# Patient Record
Sex: Female | Born: 1990 | Race: Asian | Hispanic: No | Marital: Married | Smoking: Never smoker
Health system: Southern US, Community
[De-identification: ages and names within clinical notes are randomized; demographics above are authoritative.]

## PROBLEM LIST (undated history)

## (undated) DIAGNOSIS — F32A Depression, unspecified: Secondary | ICD-10-CM

## (undated) DIAGNOSIS — D649 Anemia, unspecified: Secondary | ICD-10-CM

## (undated) DIAGNOSIS — T7840XA Allergy, unspecified, initial encounter: Secondary | ICD-10-CM

## (undated) DIAGNOSIS — F329 Major depressive disorder, single episode, unspecified: Secondary | ICD-10-CM

## (undated) DIAGNOSIS — F419 Anxiety disorder, unspecified: Secondary | ICD-10-CM

## (undated) DIAGNOSIS — Z789 Other specified health status: Secondary | ICD-10-CM

## (undated) HISTORY — PX: TOOTH EXTRACTION: SUR596

## (undated) HISTORY — DX: Allergy, unspecified, initial encounter: T78.40XA

## (undated) HISTORY — DX: Major depressive disorder, single episode, unspecified: F32.9

## (undated) HISTORY — DX: Anxiety disorder, unspecified: F41.9

## (undated) HISTORY — DX: Depression, unspecified: F32.A

## (undated) HISTORY — PX: INDUCED ABORTION: SHX677

## (undated) HISTORY — PX: NO PAST SURGERIES: SHX2092

## (undated) HISTORY — DX: Anemia, unspecified: D64.9

---
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
## 2010-10-22 LAB — WET PREP, GENITAL
Trich, Wet Prep: NONE SEEN
Yeast Wet Prep HPF POC: NONE SEEN

## 2010-10-22 LAB — URINALYSIS, ROUTINE W REFLEX MICROSCOPIC
Glucose, UA: NEGATIVE mg/dL
Ketones, ur: NEGATIVE mg/dL
Specific Gravity, Urine: 1.015 (ref 1.005–1.030)
pH: 6 (ref 5.0–8.0)

## 2010-10-22 LAB — CBC
HCT: 41.7 % (ref 36.0–46.0)
Platelets: 255 10*3/uL (ref 150–400)
RDW: 13.7 % (ref 11.5–15.5)

## 2010-10-22 LAB — COMPREHENSIVE METABOLIC PANEL
Albumin: 4.4 g/dL (ref 3.5–5.2)
Alkaline Phosphatase: 50 U/L (ref 39–117)
BUN: 15 mg/dL (ref 6–23)
Creatinine, Ser: 0.88 mg/dL (ref 0.4–1.2)
Potassium: 3.7 mEq/L (ref 3.5–5.1)
Sodium: 140 mEq/L (ref 135–145)
Total Protein: 8.3 g/dL (ref 6.0–8.3)

## 2010-10-22 LAB — DIFFERENTIAL
Basophils Relative: 0 % (ref 0–1)
Monocytes Absolute: 1.1 10*3/uL — ABNORMAL HIGH (ref 0.1–1.0)
Monocytes Relative: 6 % (ref 3–12)
Neutro Abs: 16.2 10*3/uL — ABNORMAL HIGH (ref 1.7–7.7)

## 2010-10-22 LAB — URINE MICROSCOPIC-ADD ON

## 2013-12-30 LAB — OB RESULTS CONSOLE GC/CHLAMYDIA
CHLAMYDIA, DNA PROBE: NEGATIVE
Gonorrhea: NEGATIVE

## 2013-12-30 LAB — OB RESULTS CONSOLE HIV ANTIBODY (ROUTINE TESTING): HIV: NONREACTIVE

## 2013-12-30 LAB — OB RESULTS CONSOLE ANTIBODY SCREEN: Antibody Screen: NEGATIVE

## 2013-12-30 LAB — OB RESULTS CONSOLE ABO/RH: RH Type: POSITIVE

## 2013-12-30 LAB — OB RESULTS CONSOLE RUBELLA ANTIBODY, IGM: RUBELLA: IMMUNE

## 2013-12-30 LAB — OB RESULTS CONSOLE HEPATITIS B SURFACE ANTIGEN: Hepatitis B Surface Ag: NEGATIVE

## 2013-12-30 LAB — OB RESULTS CONSOLE RPR: RPR: NONREACTIVE

## 2014-01-17 NOTE — L&D Delivery Note (Signed)
Pt was admitted in early labor. She progressed along a nl labor curve without pit. She pushed for 1 hour and then had a SVD of one live viable female infant over a 1st degree midline tear. Placenta- S/I. EBL-400cc Bilateral labial tears and perineal tear closed with 3-0 chromic.

## 2014-05-16 ENCOUNTER — Other Ambulatory Visit (HOSPITAL_COMMUNITY): Payer: Self-pay | Admitting: Obstetrics and Gynecology

## 2014-05-16 DIAGNOSIS — O365930 Maternal care for other known or suspected poor fetal growth, third trimester, not applicable or unspecified: Secondary | ICD-10-CM

## 2014-05-23 ENCOUNTER — Other Ambulatory Visit (HOSPITAL_COMMUNITY): Payer: Self-pay | Admitting: Obstetrics and Gynecology

## 2014-05-23 ENCOUNTER — Encounter (HOSPITAL_COMMUNITY): Payer: Self-pay

## 2014-05-23 ENCOUNTER — Ambulatory Visit (HOSPITAL_COMMUNITY)
Admission: RE | Admit: 2014-05-23 | Discharge: 2014-05-23 | Disposition: A | Discharge status: Home / Self Care | Payer: Medicaid Other | Source: Ambulatory Visit | Attending: Obstetrics and Gynecology | Admitting: Obstetrics and Gynecology

## 2014-05-23 DIAGNOSIS — O26843 Uterine size-date discrepancy, third trimester: Secondary | ICD-10-CM | POA: Diagnosis not present

## 2014-05-23 DIAGNOSIS — Z36 Encounter for antenatal screening of mother: Secondary | ICD-10-CM | POA: Diagnosis not present

## 2014-05-23 DIAGNOSIS — O365930 Maternal care for other known or suspected poor fetal growth, third trimester, not applicable or unspecified: Secondary | ICD-10-CM

## 2014-05-23 DIAGNOSIS — Z3A33 33 weeks gestation of pregnancy: Secondary | ICD-10-CM | POA: Diagnosis not present

## 2014-06-05 LAB — OB RESULTS CONSOLE GBS: GBS: NEGATIVE

## 2014-06-15 ENCOUNTER — Encounter (HOSPITAL_COMMUNITY): Payer: Self-pay

## 2014-06-15 ENCOUNTER — Inpatient Hospital Stay (HOSPITAL_COMMUNITY)
Admission: AD | Admit: 2014-06-15 | Discharge: 2014-06-16 | Disposition: A | Discharge status: Home / Self Care | Payer: Medicaid Other | Source: Ambulatory Visit | Attending: Obstetrics and Gynecology | Admitting: Obstetrics and Gynecology

## 2014-06-15 DIAGNOSIS — Z3A37 37 weeks gestation of pregnancy: Secondary | ICD-10-CM | POA: Insufficient documentation

## 2014-06-15 DIAGNOSIS — O9989 Other specified diseases and conditions complicating pregnancy, childbirth and the puerperium: Secondary | ICD-10-CM | POA: Insufficient documentation

## 2014-06-15 HISTORY — DX: Other specified health status: Z78.9

## 2014-06-16 DIAGNOSIS — Z3A37 37 weeks gestation of pregnancy: Secondary | ICD-10-CM | POA: Diagnosis not present

## 2014-06-16 DIAGNOSIS — O9989 Other specified diseases and conditions complicating pregnancy, childbirth and the puerperium: Secondary | ICD-10-CM | POA: Diagnosis not present

## 2014-06-16 MED ORDER — OXYCODONE-ACETAMINOPHEN 5-325 MG PO TABS
2.0000 | ORAL_TABLET | Freq: Once | ORAL | Status: AC
  Administered 2014-06-16: 2 via ORAL
  Filled 2014-06-16: qty 2

## 2014-06-16 NOTE — Discharge Instructions (Signed)
Back Pain in Pregnancy Back pain during pregnancy is common. It happens in about half of all pregnancies. It is important for you and your baby that you remain active during your pregnancy.If you feel that back pain is not allowing you to remain active or sleep well, it is time to see your caregiver. Back pain may be caused by several factors related to changes during your pregnancy.Fortunately, unless you had trouble with your back before your pregnancy, the pain is likely to get better after you deliver. Low back pain usually occurs between the fifth and seventh months of pregnancy. It can, however, happen in the first couple months. Factors that increase the risk of back problems include:   Previous back problems.  Injury to your back.  Having twins or multiple births.  A chronic cough.  Stress.  Job-related repetitive motions.  Muscle or spinal disease in the back.  Family history of back problems, ruptured (herniated) discs, or osteoporosis.  Depression, anxiety, and panic attacks. CAUSES  1. When you are pregnant, your body produces a hormone called relaxin. This hormonemakes the ligaments connecting the low back and pubic bones more flexible. This flexibility allows the baby to be delivered more easily. When your ligaments are loose, your muscles need to work harder to support your back. Soreness in your back can come from tired muscles. Soreness can also come from back tissues that are irritated since they are receiving less support. 2. As the baby grows, it puts pressure on the nerves and blood vessels in your pelvis. This can cause back pain. 3. As the baby grows and gets heavier during pregnancy, the uterus pushes the stomach muscles forward and changes your center of gravity. This makes your back muscles work harder to maintain good posture. SYMPTOMS  Lumbar pain during pregnancy Lumbar pain during pregnancy usually occurs at or above the waist in the center of the back.  There may be pain and numbness that radiates into your leg or foot. This is similar to low back pain experienced by non-pregnant women. It usually increases with sitting for long periods of time, standing, or repetitive lifting. Tenderness may also be present in the muscles along your upper back. Posterior pelvic pain during pregnancy Pain in the back of the pelvis is more common than lumbar pain in pregnancy. It is a deep pain felt in your side at the waistline, or across the tailbone (sacrum), or in both places. You may have pain on one or both sides. This pain can also go into the buttocks and backs of the upper thighs. Pubic and groin pain may also be present. The pain does not quickly resolve with rest, and morning stiffness may also be present. Pelvic pain during pregnancy can be brought on by most activities. A high level of fitness before and during pregnancy may or may not prevent this problem. Labor pain is usually 1 to 2 minutes apart, lasts for about 1 minute, and involves a bearing down feeling or pressure in your pelvis. However, if you are at term with the pregnancy, constant low back pain can be the beginning of early labor, and you should be aware of this. DIAGNOSIS  X-rays of the back should not be done during the first 12 to 14 weeks of the pregnancy and only when absolutely necessary during the rest of the pregnancy. MRIs do not give off radiation and are safe during pregnancy. MRIs also should only be done when absolutely necessary. HOME CARE INSTRUCTIONS  Exercise  as directed by your caregiver. Exercise is the most effective way to prevent or manage back pain. If you have a back problem, it is especially important to avoid sports that require sudden body movements. Swimming and walking are great activities.  Do not stand in one place for long periods of time.  Do not wear high heels.  Sit in chairs with good posture. Use a pillow on your lower back if necessary. Make sure your  head rests over your shoulders and is not hanging forward.  Try sleeping on your side, preferably the left side, with a pillow or two between your legs. If you are sore after a night's rest, your bedmay betoo soft.Try placing a board between your mattress and box spring.  Listen to your body when lifting.If you are experiencing pain, ask for help or try bending yourknees more so you can use your leg muscles rather than your back muscles. Squat down when picking up something from the floor. Do not bend over.  Eat a healthy diet. Try to gain weight within your caregiver's recommendations.  Use heat or cold packs 3 to 4 times a day for 15 minutes to help with the pain.  Only take over-the-counter or prescription medicines for pain, discomfort, or fever as directed by your caregiver. Sudden (acute) back pain  Use bed rest for only the most extreme, acute episodes of back pain. Prolonged bed rest over 48 hours will aggravate your condition.  Ice is very effective for acute conditions.  Put ice in a plastic bag.  Place a towel between your skin and the bag.  Leave the ice on for 10 to 20 minutes every 2 hours, or as needed.  Using heat packs for 30 minutes prior to activities is also helpful. Continued back pain See your caregiver if you have continued problems. Your caregiver can help or refer you for appropriate physical therapy. With conditioning, most back problems can be avoided. Sometimes, a more serious issue may be the cause of back pain. You should be seen right away if new problems seem to be developing. Your caregiver may recommend:  A maternity girdle.  An elastic sling.  A back brace.  A massage therapist or acupuncture. SEEK MEDICAL CARE IF:   You are not able to do most of your daily activities, even when taking the pain medicine you were given.  You need a referral to a physical therapist or chiropractor.  You want to try acupuncture. SEEK IMMEDIATE MEDICAL  CARE IF:  You develop numbness, tingling, weakness, or problems with the use of your arms or legs.  You develop severe back pain that is no longer relieved with medicines.  You have a sudden change in bowel or bladder control.  You have increasing pain in other areas of the body.  You develop shortness of breath, dizziness, or fainting.  You develop nausea, vomiting, or sweating.  You have back pain which is similar to labor pains.  You have back pain along with your water breaking or vaginal bleeding.  You have back pain or numbness that travels down your leg.  Your back pain developed after you fell.  You develop pain on one side of your back. You may have a kidney stone.  You see blood in your urine. You may have a bladder infection or kidney stone.  You have back pain with blisters. You may have shingles. Back pain is fairly common during pregnancy but should not be accepted as just part of  the process. Back pain should always be treated as soon as possible. This will make your pregnancy as pleasant as possible. Document Released: 04/13/2005 Document Revised: 03/28/2011 Document Reviewed: 05/25/2010 [facility] Patient Information 2015 [place], [place]. This information is not intended to replace advice given to you by your health care provider. Make sure you discuss any questions you have with your health care provider.   Fetal Movement Counts Patient Name: __________________________________________________ Patient Due Date: ____________________ Performing a fetal movement count is highly recommended in high-risk pregnancies, but it is good for every pregnant woman to do. Your health care provider may ask you to start counting fetal movements at 28 weeks of the pregnancy. Fetal movements often increase:  After eating a full meal.  After physical activity.  After eating or drinking something sweet or cold.  At rest. Pay attention to when you feel the baby is most active.  This will help you notice a pattern of your baby's sleep and wake cycles and what factors contribute to an increase in fetal movement. It is important to perform a fetal movement count at the same time each day when your baby is normally most active.  HOW TO COUNT FETAL MOVEMENTS 4. Find a quiet and comfortable area to sit or lie down on your left side. Lying on your left side provides the best blood and oxygen circulation to your baby. 5. Write down the day and time on a sheet of paper or in a journal. 6. Start counting kicks, flutters, swishes, rolls, or jabs in a 2-hour period. You should feel at least 10 movements within 2 hours. 7. If you do not feel 10 movements in 2 hours, wait 2-3 hours and count again. Look for a change in the pattern or not enough counts in 2 hours. SEEK MEDICAL CARE IF:  You feel less than 10 counts in 2 hours, tried twice.  There is no movement in over an hour.  The pattern is changing or taking longer each day to reach 10 counts in 2 hours.  You feel the baby is not moving as he or she usually does. Date: ____________ Movements: ____________ Start time: ____________ Elizebeth Koller time: ____________  Date: ____________ Movements: ____________ Start time: ____________ Elizebeth Koller time: ____________ Date: ____________ Movements: ____________ Start time: ____________ Elizebeth Koller time: ____________ Date: ____________ Movements: ____________ Start time: ____________ Elizebeth Koller time: ____________ Date: ____________ Movements: ____________ Start time: ____________ Elizebeth Koller time: ____________ Date: ____________ Movements: ____________ Start time: ____________ Elizebeth Koller time: ____________ Date: ____________ Movements: ____________ Start time: ____________ Elizebeth Koller time: ____________ Date: ____________ Movements: ____________ Start time: ____________ Elizebeth Koller time: ____________  Date: ____________ Movements: ____________ Start time: ____________ Elizebeth Koller time: ____________ Date: ____________ Movements:  ____________ Start time: ____________ Elizebeth Koller time: ____________ Date: ____________ Movements: ____________ Start time: ____________ Elizebeth Koller time: ____________ Date: ____________ Movements: ____________ Start time: ____________ Elizebeth Koller time: ____________ Date: ____________ Movements: ____________ Start time: ____________ Elizebeth Koller time: ____________ Date: ____________ Movements: ____________ Start time: ____________ Elizebeth Koller time: ____________ Date: ____________ Movements: ____________ Start time: ____________ Elizebeth Koller time: ____________  Date: ____________ Movements: ____________ Start time: ____________ Elizebeth Koller time: ____________ Date: ____________ Movements: ____________ Start time: ____________ Elizebeth Koller time: ____________ Date: ____________ Movements: ____________ Start time: ____________ Elizebeth Koller time: ____________ Date: ____________ Movements: ____________ Start time: ____________ Elizebeth Koller time: ____________ Date: ____________ Movements: ____________ Start time: ____________ Elizebeth Koller time: ____________ Date: ____________ Movements: ____________ Start time: ____________ Elizebeth Koller time: ____________ Date: ____________ Movements: ____________ Start time: ____________ Elizebeth Koller time: ____________  Date: ____________ Movements: ____________ Start time: ____________ Elizebeth Koller time: ____________ Date: ____________  Movements: ____________ Start time: ____________ Elizebeth Koller time: ____________ Date: ____________ Movements: ____________ Start time: ____________ Elizebeth Koller time: ____________ Date: ____________ Movements: ____________ Start time: ____________ Elizebeth Koller time: ____________ Date: ____________ Movements: ____________ Start time: ____________ Elizebeth Koller time: ____________ Date: ____________ Movements: ____________ Start time: ____________ Elizebeth Koller time: ____________ Date: ____________ Movements: ____________ Start time: ____________ Elizebeth Koller time: ____________  Date: ____________ Movements: ____________ Start time: ____________ Elizebeth Koller  time: ____________ Date: ____________ Movements: ____________ Start time: ____________ Elizebeth Koller time: ____________ Date: ____________ Movements: ____________ Start time: ____________ Elizebeth Koller time: ____________ Date: ____________ Movements: ____________ Start time: ____________ Elizebeth Koller time: ____________ Date: ____________ Movements: ____________ Start time: ____________ Elizebeth Koller time: ____________ Date: ____________ Movements: ____________ Start time: ____________ Elizebeth Koller time: ____________ Date: ____________ Movements: ____________ Start time: ____________ Elizebeth Koller time: ____________  Date: ____________ Movements: ____________ Start time: ____________ Elizebeth Koller time: ____________ Date: ____________ Movements: ____________ Start time: ____________ Elizebeth Koller time: ____________ Date: ____________ Movements: ____________ Start time: ____________ Elizebeth Koller time: ____________ Date: ____________ Movements: ____________ Start time: ____________ Elizebeth Koller time: ____________ Date: ____________ Movements: ____________ Start time: ____________ Elizebeth Koller time: ____________ Date: ____________ Movements: ____________ Start time: ____________ Elizebeth Koller time: ____________ Date: ____________ Movements: ____________ Start time: ____________ Elizebeth Koller time: ____________  Date: ____________ Movements: ____________ Start time: ____________ Elizebeth Koller time: ____________ Date: ____________ Movements: ____________ Start time: ____________ Elizebeth Koller time: ____________ Date: ____________ Movements: ____________ Start time: ____________ Elizebeth Koller time: ____________ Date: ____________ Movements: ____________ Start time: ____________ Elizebeth Koller time: ____________ Date: ____________ Movements: ____________ Start time: ____________ Elizebeth Koller time: ____________ Date: ____________ Movements: ____________ Start time: ____________ Elizebeth Koller time: ____________ Date: ____________ Movements: ____________ Start time: ____________ Elizebeth Koller time: ____________  Date: ____________  Movements: ____________ Start time: ____________ Elizebeth Koller time: ____________ Date: ____________ Movements: ____________ Start time: ____________ Elizebeth Koller time: ____________ Date: ____________ Movements: ____________ Start time: ____________ Elizebeth Koller time: ____________ Date: ____________ Movements: ____________ Start time: ____________ Elizebeth Koller time: ____________ Date: ____________ Movements: ____________ Start time: ____________ Elizebeth Koller time: ____________ Date: ____________ Movements: ____________ Start time: ____________ Elizebeth Koller time: ____________ Document Released: 02/02/2006 Document Revised: 05/20/2013 Document Reviewed: 10/31/2011 [employer] Patient Information 2015 [place] [employer]. This information is not intended to replace advice given to you by your health care provider. Make sure you discuss any questions you have with your health care provider.

## 2014-06-19 ENCOUNTER — Encounter (HOSPITAL_COMMUNITY): Payer: Self-pay | Admitting: *Deleted

## 2014-06-19 ENCOUNTER — Inpatient Hospital Stay (HOSPITAL_COMMUNITY)
Admission: AD | Admit: 2014-06-19 | Discharge: 2014-06-19 | Disposition: A | Discharge status: Home / Self Care | Payer: Medicaid Other | Source: Ambulatory Visit | Attending: Obstetrics and Gynecology | Admitting: Obstetrics and Gynecology

## 2014-06-19 DIAGNOSIS — O471 False labor at or after 37 completed weeks of gestation: Secondary | ICD-10-CM | POA: Diagnosis not present

## 2014-06-19 DIAGNOSIS — Z3A37 37 weeks gestation of pregnancy: Secondary | ICD-10-CM | POA: Diagnosis not present

## 2014-06-19 NOTE — MAU Provider Note (Signed)
Chief Complaint:  No chief complaint on file.   First Provider Initiated Contact with Patient 06/19/14 [phone]     HPI: Casey Dennis is a 24 y.o. G2P0010 at [redacted]w[redacted]d who presents to maternity admissions reporting contractions and lower back pain since 0500. Pain radiates down left thigh. Denies fever, chills, leakage of fluid or vaginal bleeding. Good fetal movement.   Pregnancy Course: Uncomplicated.   Past Medical History: Past Medical History  Diagnosis Date  . Medical history non-contributory     Past obstetric history: OB History  Gravida Para Term Preterm AB SAB TAB Ectopic Multiple Living  2    1  1        # Outcome Date GA Lbr Len/2nd Weight Sex Delivery Anes PTL Lv  2 Current           1 TAB               Past Surgical History: Past Surgical History  Procedure Laterality Date  . No past surgeries       Family History: Family History  Problem Relation Age of Onset  . Cancer Father     Social History: History  Substance Use Topics  . Smoking status: Never Smoker   . Smokeless tobacco: Not on file  . Alcohol Use: No    Allergies: No Known Allergies  Meds:  Prescriptions prior to admission  Medication Sig Dispense Refill Last Dose  . acetaminophen (TYLENOL) 500 MG tablet Take 1,000 mg by mouth every 6 (six) hours as needed.   06/15/2014 at 1600  . Prenatal Vit-Fe Fumarate-FA (PRENATAL MULTIVITAMIN) TABS tablet Take 1 tablet by mouth daily at 12 noon.   06/15/2014 at Unknown time    ROS:  Review of Systems  Constitutional: Negative for fever and chills.  Gastrointestinal: Positive for abdominal pain (contractions).  Genitourinary: Negative for flank pain.  Musculoskeletal: Positive for back pain. Negative for myalgias.       Neg for difficulties w/ ambulation.  Neurological: Negative for tingling.    Physical Exam  Blood pressure 115/70, pulse 79, temperature 98.2 F (36.8 C), temperature source Oral, resp. rate 18, height 5' 1.5" (1.562 m), weight 148 lb  (67.132 kg), SpO2 100 %. GENERAL: Well-developed, well-nourished female in mild distress.  HEART: normal rate RESP: normal effort GI: Abd soft, non-tender, gravid appropriate for gestational age.  MS: Extremities nontender, no edema, normal ROM NEURO: Alert and oriented x 4.  GU: NEFG, physiologic discharge, no blood. No CVAT Dilation: 2 Effacement (%): 70 Cervical Position: Posterior Station: -3 Presentation: Vertex Exam by:: Marlou Porch CNM  FHT:  Baseline 135 , moderate variability, accelerations present, no decelerations Contractions: q 3-10 mins, mild-moderate   Labs: No results found for this or any previous visit (from the past 24 hour(s)).  Imaging:  NA  MAU Course: No cervical change in 1 hour. LBP R/T contractions, but may also be be sciatica.   Assessment: 1. False labor after 37 completed weeks of gestation    Plan: Discharge home in stable condition.  Labor precautions and fetal kick counts   Follow-up Information    Follow up with Caffie Damme, MD On 06/20/2014.   Specialty:  Obstetrics and Gynecology   Why:  Routine prenatal visit or sooner As needed if symptoms worsen   Contact information:   [place] [address] [phone]       Follow up with [place].   Why:  As needed in emergencies   Contact information:   [address] [id number] [place] [phone]        Medication List    ASK your doctor about these medications        acetaminophen 500 MG tablet  Commonly known as:  TYLENOL  Take 1,000 mg by mouth every 6 (six) hours as needed.     prenatal multivitamin Tabs tablet  Take 1 tablet by mouth daily at 12 noon.        [place], [place] 06/19/2014 8:27 AM

## 2014-06-19 NOTE — Discharge Instructions (Signed)
Braxton Hicks Contractions °Contractions of the uterus can occur throughout pregnancy. Contractions are not always a sign that you are in labor.  °WHAT ARE BRAXTON HICKS CONTRACTIONS?  °Contractions that occur before labor are called Braxton Hicks contractions, or false labor. Toward the end of pregnancy (32-34 weeks), these contractions can develop more often and may become more forceful. This is not true labor because these contractions do not result in opening (dilatation) and thinning of the cervix. They are sometimes difficult to tell apart from true labor because these contractions can be forceful and people have different pain tolerances. You should not feel embarrassed if you go to the hospital with false labor. Sometimes, the only way to tell if you are in true labor is for your health care provider to look for changes in the cervix. °If there are no prenatal problems or other health problems associated with the pregnancy, it is completely safe to be sent home with false labor and await the onset of true labor. °HOW CAN YOU TELL THE DIFFERENCE BETWEEN TRUE AND FALSE LABOR? °False Labor °· The contractions of false labor are usually shorter and not as hard as those of true labor.   °· The contractions are usually irregular.   °· The contractions are often felt in the front of the lower abdomen and in the groin.   °· The contractions may go away when you walk around or change positions while lying down.   °· The contractions get weaker and are shorter lasting as time goes on.   °· The contractions do not usually become progressively stronger, regular, and closer together as with true labor.   °True Labor °1. Contractions in true labor last 30-70 seconds, become very regular, usually become more intense, and increase in frequency.   °2. The contractions do not go away with walking.   °3. The discomfort is usually felt in the top of the uterus and spreads to the lower abdomen and low back.   °4. True labor can  be determined by your health care provider with an exam. This will show that the cervix is dilating and getting thinner.   °WHAT TO REMEMBER °· Keep up with your usual exercises and follow other instructions given by your health care provider.   °· Take medicines as directed by your health care provider.   °· Keep your regular prenatal appointments.   °· Eat and drink lightly if you think you are going into labor.   °· If Braxton Hicks contractions are making you uncomfortable:   °· Change your position from lying down or resting to walking, or from walking to resting.   °· Sit and rest in a tub of warm water.   °· Drink 2-3 glasses of water. Dehydration may cause these contractions.   °· Do slow and deep breathing several times an hour.   °WHEN SHOULD I SEEK IMMEDIATE MEDICAL CARE? °Seek immediate medical care if: °· Your contractions become stronger, more regular, and closer together.   °· You have fluid leaking or gushing from your vagina.   °· You have a fever.   °· You pass blood-tinged mucus.   °· You have vaginal bleeding.   °· You have continuous abdominal pain.   °· You have low back pain that you never had before.   °· You feel your baby's head pushing down and causing pelvic pressure.   °· Your baby is not moving as much as it used to.   °Document Released: 01/03/2005 Document Revised: 01/08/2013 Document Reviewed: 10/15/2012 °ExitCare® Patient Information ©2015 [employer]. This information is not intended to replace advice given to you by your health care   provider. Make sure you discuss any questions you have with your health care provider. ° °Fetal Movement Counts °Patient Name: __________________________________________________ Patient Due Date: ____________________ °Performing a fetal movement count is highly recommended in high-risk pregnancies, but it is good for every pregnant woman to do. Your health care provider may ask you to start counting fetal movements at 28 weeks of the pregnancy. Fetal  movements often increase: °· After eating a full meal. °· After physical activity. °· After eating or drinking something sweet or cold. °· At rest. °Pay attention to when you feel the baby is most active. This will help you notice a pattern of your baby's sleep and wake cycles and what factors contribute to an increase in fetal movement. It is important to perform a fetal movement count at the same time each day when your baby is normally most active.  °HOW TO COUNT FETAL MOVEMENTS °5. Find a quiet and comfortable area to sit or lie down on your left side. Lying on your left side provides the best blood and oxygen circulation to your baby. °6. Write down the day and time on a sheet of paper or in a journal. °7. Start counting kicks, flutters, swishes, rolls, or jabs in a 2-hour period. You should feel at least 10 movements within 2 hours. °8. If you do not feel 10 movements in 2 hours, wait 2-3 hours and count again. Look for a change in the pattern or not enough counts in 2 hours. °SEEK MEDICAL CARE IF: °· You feel less than 10 counts in 2 hours, tried twice. °· There is no movement in over an hour. °· The pattern is changing or taking longer each day to reach 10 counts in 2 hours. °· You feel the baby is not moving as he or she usually does. °Date: ____________ Movements: ____________ Start time: ____________ Finish time: ____________  °Date: ____________ Movements: ____________ Start time: ____________ Finish time: ____________ °Date: ____________ Movements: ____________ Start time: ____________ Finish time: ____________ °Date: ____________ Movements: ____________ Start time: ____________ Finish time: ____________ °Date: ____________ Movements: ____________ Start time: ____________ Finish time: ____________ °Date: ____________ Movements: ____________ Start time: ____________ Finish time: ____________ °Date: ____________ Movements: ____________ Start time: ____________ Finish time: ____________ °Date: ____________  Movements: ____________ Start time: ____________ Finish time: ____________  °Date: ____________ Movements: ____________ Start time: ____________ Finish time: ____________ °Date: ____________ Movements: ____________ Start time: ____________ Finish time: ____________ °Date: ____________ Movements: ____________ Start time: ____________ Finish time: ____________ °Date: ____________ Movements: ____________ Start time: ____________ Finish time: ____________ °Date: ____________ Movements: ____________ Start time: ____________ Finish time: ____________ °Date: ____________ Movements: ____________ Start time: ____________ Finish time: ____________ °Date: ____________ Movements: ____________ Start time: ____________ Finish time: ____________  °Date: ____________ Movements: ____________ Start time: ____________ Finish time: ____________ °Date: ____________ Movements: ____________ Start time: ____________ Finish time: ____________ °Date: ____________ Movements: ____________ Start time: ____________ Finish time: ____________ °Date: ____________ Movements: ____________ Start time: ____________ Finish time: ____________ °Date: ____________ Movements: ____________ Start time: ____________ Finish time: ____________ °Date: ____________ Movements: ____________ Start time: ____________ Finish time: ____________ °Date: ____________ Movements: ____________ Start time: ____________ Finish time: ____________  °Date: ____________ Movements: ____________ Start time: ____________ Finish time: ____________ °Date: ____________ Movements: ____________ Start time: ____________ Finish time: ____________ °Date: ____________ Movements: ____________ Start time: ____________ Finish time: ____________ °Date: ____________ Movements: ____________ Start time: ____________ Finish time: ____________ °Date: ____________ Movements: ____________ Start time: ____________ Finish time: ____________ °Date: ____________ Movements: ____________ Start time:  ____________ Finish time: ____________ °Date: ____________ Movements:   ____________ Start time: ____________ Finish time: ____________  °Date: ____________ Movements: ____________ Start time: ____________ Finish time: ____________ °Date: ____________ Movements: ____________ Start time: ____________ Finish time: ____________ °Date: ____________ Movements: ____________ Start time: ____________ Finish time: ____________ °Date: ____________ Movements: ____________ Start time: ____________ Finish time: ____________ °Date: ____________ Movements: ____________ Start time: ____________ Finish time: ____________ °Date: ____________ Movements: ____________ Start time: ____________ Finish time: ____________ °Date: ____________ Movements: ____________ Start time: ____________ Finish time: ____________  °Date: ____________ Movements: ____________ Start time: ____________ Finish time: ____________ °Date: ____________ Movements: ____________ Start time: ____________ Finish time: ____________ °Date: ____________ Movements: ____________ Start time: ____________ Finish time: ____________ °Date: ____________ Movements: ____________ Start time: ____________ Finish time: ____________ °Date: ____________ Movements: ____________ Start time: ____________ Finish time: ____________ °Date: ____________ Movements: ____________ Start time: ____________ Finish time: ____________ °Date: ____________ Movements: ____________ Start time: ____________ Finish time: ____________  °Date: ____________ Movements: ____________ Start time: ____________ Finish time: ____________ °Date: ____________ Movements: ____________ Start time: ____________ Finish time: ____________ °Date: ____________ Movements: ____________ Start time: ____________ Finish time: ____________ °Date: ____________ Movements: ____________ Start time: ____________ Finish time: ____________ °Date: ____________ Movements: ____________ Start time: ____________ Finish time: ____________ °Date:  ____________ Movements: ____________ Start time: ____________ Finish time: ____________ °Date: ____________ Movements: ____________ Start time: ____________ Finish time: ____________  °Date: ____________ Movements: ____________ Start time: ____________ Finish time: ____________ °Date: ____________ Movements: ____________ Start time: ____________ Finish time: ____________ °Date: ____________ Movements: ____________ Start time: ____________ Finish time: ____________ °Date: ____________ Movements: ____________ Start time: ____________ Finish time: ____________ °Date: ____________ Movements: ____________ Start time: ____________ Finish time: ____________ °Date: ____________ Movements: ____________ Start time: ____________ Finish time: ____________ °Document Released: 02/02/2006 Document Revised: 05/20/2013 Document Reviewed: 10/31/2011 °Exit[employer]® Patient Information ©2015 [employer]. This information is not intended to replace advice given to you by your health care provider. Make sure you discuss any questions you have with your health care provider. ° °

## 2014-06-19 NOTE — MAU Note (Signed)
C/o lower back pain since 0500; c/o uc since 0500; c/o L thigh pain for past 2 weeks; seen in MAU on Sun (4 days ago) for same symptoms;

## 2014-06-19 NOTE — MAU Note (Signed)
Pt reports contractions q 10 minutes , very painful per pt. Lower back pain.

## 2014-06-23 ENCOUNTER — Inpatient Hospital Stay (HOSPITAL_COMMUNITY): Payer: Medicaid Other | Admitting: Anesthesiology

## 2014-06-23 ENCOUNTER — Encounter (HOSPITAL_COMMUNITY): Payer: Self-pay | Admitting: *Deleted

## 2014-06-23 ENCOUNTER — Inpatient Hospital Stay (HOSPITAL_COMMUNITY)
Admission: AD | Admit: 2014-06-23 | Discharge: 2014-06-26 | DRG: 775 | Disposition: A | Discharge status: Home / Self Care | Payer: Medicaid Other | Source: Ambulatory Visit | Attending: Obstetrics and Gynecology | Admitting: Obstetrics and Gynecology

## 2014-06-23 DIAGNOSIS — Z3A38 38 weeks gestation of pregnancy: Secondary | ICD-10-CM | POA: Diagnosis present

## 2014-06-23 DIAGNOSIS — N858 Other specified noninflammatory disorders of uterus: Secondary | ICD-10-CM | POA: Diagnosis present

## 2014-06-23 LAB — POCT FERN TEST: POCT Fern Test: NEGATIVE

## 2014-06-23 LAB — CBC
HCT: 33.1 % — ABNORMAL LOW (ref 36.0–46.0)
HEMOGLOBIN: 11.3 g/dL — AB (ref 12.0–15.0)
MCH: 26.8 pg (ref 26.0–34.0)
MCHC: 34.1 g/dL (ref 30.0–36.0)
MCV: 78.6 fL (ref 78.0–100.0)
PLATELETS: 275 10*3/uL (ref 150–400)
RBC: 4.21 MIL/uL (ref 3.87–5.11)
RDW: 14.5 % (ref 11.5–15.5)
WBC: 13.5 10*3/uL — ABNORMAL HIGH (ref 4.0–10.5)

## 2014-06-23 MED ORDER — FENTANYL 2.5 MCG/ML BUPIVACAINE 1/10 % EPIDURAL INFUSION (WH - ANES): 14.0000 mL/h | INTRAMUSCULAR | Status: DC | PRN

## 2014-06-23 MED ORDER — CITRIC ACID-SODIUM CITRATE 334-500 MG/5ML PO SOLN
30.0000 mL | ORAL | Status: DC | PRN
  Administered 2014-06-24: 30 mL via ORAL
  Filled 2014-06-23: qty 15

## 2014-06-23 MED ORDER — FENTANYL 2.5 MCG/ML BUPIVACAINE 1/10 % EPIDURAL INFUSION (WH - ANES)
14.0000 mL/h | INTRAMUSCULAR | Status: DC | PRN
  Administered 2014-06-24: 14 mL/h via EPIDURAL
  Filled 2014-06-23: qty 125

## 2014-06-23 MED ORDER — LACTATED RINGERS IV SOLN
500.0000 mL | INTRAVENOUS | Status: DC | PRN
Start: 2014-06-23 — End: 2014-06-24
  Administered 2014-06-23: 500 mL via INTRAVENOUS

## 2014-06-23 MED ORDER — OXYTOCIN 40 UNITS IN LACTATED RINGERS INFUSION - SIMPLE MED
62.5000 mL/h | INTRAVENOUS | Status: DC
  Filled 2014-06-23: qty 1000

## 2014-06-23 MED ORDER — LACTATED RINGERS IV SOLN
INTRAVENOUS | Status: DC
  Administered 2014-06-23 – 2014-06-24 (×2): via INTRAVENOUS

## 2014-06-23 MED ORDER — FLEET ENEMA 7-19 GM/118ML RE ENEM: 1.0000 | ENEMA | RECTAL | Status: DC | PRN

## 2014-06-23 MED ORDER — OXYTOCIN BOLUS FROM INFUSION: 500.0000 mL | INTRAVENOUS | Status: DC

## 2014-06-23 MED ORDER — OXYCODONE-ACETAMINOPHEN 5-325 MG PO TABS: 2.0000 | ORAL_TABLET | ORAL | Status: DC | PRN

## 2014-06-23 MED ORDER — DIPHENHYDRAMINE HCL 50 MG/ML IJ SOLN: 12.5000 mg | INTRAMUSCULAR | Status: DC | PRN

## 2014-06-23 MED ORDER — ONDANSETRON HCL 4 MG/2ML IJ SOLN: 4.0000 mg | Freq: Four times a day (QID) | INTRAMUSCULAR | Status: DC | PRN

## 2014-06-23 MED ORDER — PHENYLEPHRINE 40 MCG/ML (10ML) SYRINGE FOR IV PUSH (FOR BLOOD PRESSURE SUPPORT)
80.0000 ug | PREFILLED_SYRINGE | INTRAVENOUS | Status: DC | PRN
  Filled 2014-06-23: qty 2
  Filled 2014-06-23: qty 20

## 2014-06-23 MED ORDER — OXYCODONE-ACETAMINOPHEN 5-325 MG PO TABS: 1.0000 | ORAL_TABLET | ORAL | Status: DC | PRN

## 2014-06-23 MED ORDER — LIDOCAINE HCL (PF) 1 % IJ SOLN
30.0000 mL | INTRAMUSCULAR | Status: DC | PRN
  Administered 2014-06-24: 30 mL via SUBCUTANEOUS
  Filled 2014-06-23: qty 30

## 2014-06-23 MED ORDER — ACETAMINOPHEN 325 MG PO TABS
650.0000 mg | ORAL_TABLET | ORAL | Status: DC | PRN
  Administered 2014-06-24: 650 mg via ORAL
  Filled 2014-06-23: qty 2

## 2014-06-23 MED ORDER — EPHEDRINE 5 MG/ML INJ
10.0000 mg | INTRAVENOUS | Status: DC | PRN
  Filled 2014-06-23: qty 2

## 2014-06-23 NOTE — MAU Note (Signed)
Pt reports she was seen in the office this am and was contracting and was 3cm. Was told to go walk around a while and then come to the hospital. Still contracting but unsure how often. Denies bleeding or ROM

## 2014-06-23 NOTE — Anesthesia Preprocedure Evaluation (Addendum)

## 2014-06-24 ENCOUNTER — Encounter (HOSPITAL_COMMUNITY): Payer: Self-pay | Admitting: *Deleted

## 2014-06-24 LAB — TYPE AND SCREEN
ABO/RH(D): A POS
ANTIBODY SCREEN: NEGATIVE

## 2014-06-24 LAB — RPR: RPR: NONREACTIVE

## 2014-06-24 LAB — ABO/RH: ABO/RH(D): A POS

## 2014-06-24 MED ORDER — SIMETHICONE 80 MG PO CHEW: 80.0000 mg | CHEWABLE_TABLET | ORAL | Status: DC | PRN

## 2014-06-24 MED ORDER — BENZOCAINE-MENTHOL 20-0.5 % EX AERO
1.0000 "application " | INHALATION_SPRAY | CUTANEOUS | Status: DC | PRN
  Administered 2014-06-24: 1 via TOPICAL
  Filled 2014-06-24: qty 56

## 2014-06-24 MED ORDER — DIBUCAINE 1 % RE OINT: 1.0000 "application " | TOPICAL_OINTMENT | RECTAL | Status: DC | PRN

## 2014-06-24 MED ORDER — TETANUS-DIPHTH-ACELL PERTUSSIS 5-2.5-18.5 LF-MCG/0.5 IM SUSP: 0.5000 mL | Freq: Once | INTRAMUSCULAR | Status: DC

## 2014-06-24 MED ORDER — ONDANSETRON HCL 4 MG PO TABS: 4.0000 mg | ORAL_TABLET | ORAL | Status: DC | PRN

## 2014-06-24 MED ORDER — OXYCODONE-ACETAMINOPHEN 5-325 MG PO TABS
1.0000 | ORAL_TABLET | ORAL | Status: DC | PRN
  Administered 2014-06-24 (×2): 1 via ORAL
  Filled 2014-06-24 (×2): qty 1

## 2014-06-24 MED ORDER — PRENATAL MULTIVITAMIN CH
1.0000 | ORAL_TABLET | Freq: Every day | ORAL | Status: DC
  Administered 2014-06-24 – 2014-06-26 (×3): 1 via ORAL
  Filled 2014-06-24 (×3): qty 1

## 2014-06-24 MED ORDER — IBUPROFEN 600 MG PO TABS
600.0000 mg | ORAL_TABLET | Freq: Four times a day (QID) | ORAL | Status: DC
  Administered 2014-06-24 – 2014-06-26 (×9): 600 mg via ORAL
  Filled 2014-06-24 (×10): qty 1

## 2014-06-24 MED ORDER — ONDANSETRON HCL 4 MG/2ML IJ SOLN: 4.0000 mg | INTRAMUSCULAR | Status: DC | PRN

## 2014-06-24 MED ORDER — ACETAMINOPHEN 325 MG PO TABS: 650.0000 mg | ORAL_TABLET | ORAL | Status: DC | PRN

## 2014-06-24 MED ORDER — WITCH HAZEL-GLYCERIN EX PADS: 1.0000 "application " | MEDICATED_PAD | CUTANEOUS | Status: DC | PRN

## 2014-06-24 MED ORDER — LIDOCAINE HCL (PF) 1 % IJ SOLN
INTRAMUSCULAR | Status: DC | PRN
  Administered 2014-06-24: 6 mL
  Administered 2014-06-24: 4 mL

## 2014-06-24 MED ORDER — LANOLIN HYDROUS EX OINT: TOPICAL_OINTMENT | CUTANEOUS | Status: DC | PRN

## 2014-06-24 MED ORDER — ZOLPIDEM TARTRATE 5 MG PO TABS: 5.0000 mg | ORAL_TABLET | Freq: Every evening | ORAL | Status: DC | PRN

## 2014-06-24 MED ORDER — DIPHENHYDRAMINE HCL 25 MG PO CAPS: 25.0000 mg | ORAL_CAPSULE | Freq: Four times a day (QID) | ORAL | Status: DC | PRN

## 2014-06-24 MED ORDER — SENNOSIDES-DOCUSATE SODIUM 8.6-50 MG PO TABS
2.0000 | ORAL_TABLET | ORAL | Status: DC
  Administered 2014-06-25 – 2014-06-26 (×2): 2 via ORAL
  Filled 2014-06-24 (×2): qty 2

## 2014-06-24 MED ORDER — OXYCODONE-ACETAMINOPHEN 5-325 MG PO TABS
2.0000 | ORAL_TABLET | ORAL | Status: DC | PRN
  Administered 2014-06-24 – 2014-06-26 (×6): 2 via ORAL
  Filled 2014-06-24 (×6): qty 2

## 2014-06-24 NOTE — Lactation Note (Signed)
This note was copied from the chart of Casey Harper Pape. Lactation Consultation Note Mom concerned about baby not eating. Baby has no interest in BF at this time. Had a large throthy emesis. Mom and dad upset. New mom scared about the spit up. Mom was changing baby's poop diaper and baby started spitting up and scared her. Baby was fine, suctions mouth. Educated about newborn behavior. Mom encouraged to do skin-to-skin. Mom encouraged to waken baby for feeds. Mom encouraged to feed baby 8-12 times/24 hours and with feeding cues. Mom stated baby had been sleepy and has tried to BF put her on the breast and will not feed. May suck a few times. Mom has perky breast w/bouncy areolas and everted nipples. Hand expression to Rt. Breast, saw glistening of colostrum. Lt. Breast didn't see any colostrum. Mom denies leaking but has had some changes during pregnancy. Gave hand pump to stimulate breast.  Assisted in football position and worked w/baby to BF, only done a sucks. Encouraged parents to relax, talk to baby and stimulate for about 20 min. Then try again if cueing sooner than 2 hours, if no cue then stimulate baby and try to BF again. Encouraged lots of STS. Encouraged I&O. Patient Name: Casey Dennis LMBEM'L Date: 06/24/2014 Reason for consult: Follow-up assessment;Difficult latch   Maternal Data    Feeding Feeding Type: Breast Fed Length of feed: 3 min (still trying)  LATCH Score/Interventions Latch: Too sleepy or reluctant, no latch achieved, no sucking elicited. Intervention(s): Skin to skin;Teach feeding cues;Waking techniques  Audible Swallowing: None Intervention(s): Skin to skin;Hand expression  Type of Nipple: Everted at rest and after stimulation  Comfort (Breast/Nipple): Soft / non-tender     Hold (Positioning): Assistance needed to correctly position infant at breast and maintain latch. Intervention(s): Skin to skin;Position options;Support Pillows;Breastfeeding basics  reviewed  LATCH Score: 5  Lactation Tools Discussed/Used Tools: Pump Breast pump type: Manual Pump Review: Setup, frequency, and cleaning;Milk Storage Initiated by:: Allayne Stack RN Date initiated:: 06/24/14   Consult Status Consult Status: Follow-up Date: 06/26/14 Follow-up type: In-patient    Theodoro Kalata 06/24/2014, 10:15 PM

## 2014-06-24 NOTE — H&P (Signed)
Pt is a 24 y/o female, G2P0 at term who presents to the ER c/o ctxs. Pt was observed then admitted for labor. Her PNC was uncomplicated.  PMHX: See PNR. VSSAF PE: VSSAF        HEENT-wnl        ABD-gravid, palp ctxs        FHTs- reactive IMP/ IUP at term          Labor PLAN/ Admit

## 2014-06-24 NOTE — Progress Notes (Signed)
UR chart review completed.  

## 2014-06-24 NOTE — Anesthesia Procedure Notes (Signed)
Epidural Patient location during procedure: OB  Preanesthetic Checklist Completed: patient identified, site marked, surgical consent, pre-op evaluation, timeout performed, IV checked, risks and benefits discussed and monitors and equipment checked  Epidural Patient position: sitting Prep: site prepped and draped and DuraPrep Patient monitoring: continuous pulse ox and blood pressure Approach: midline Location: L3-L4 Injection technique: LOR air  Needle:  Needle type: Tuohy  Needle gauge: 17 G Needle length: 9 cm and 9 Needle insertion depth: 5 cm cm Catheter type: closed end flexible Catheter size: 19 Gauge Catheter at skin depth: 10 cm Test dose: negative  Assessment Events: blood not aspirated, injection not painful, no injection resistance, negative IV test and no paresthesia  Additional Notes Dosing of Epidural:  1st dose, through catheter ............................................Marland Kitchen  Xylocaine 40 mg  2nd dose, through catheter, after waiting 3 minutes........Marland KitchenXylocaine 60 mg    ( 1% Xylo charted as a single dose in Epic Meds for ease of charting; actual dosing was fractionated as above, for saftey's sake)  As each dose occurred, patient was free of IV sx; and patient exhibited no evidence of SA injection.  Patient is more comfortable after epidural dosed. Please see RN's note for documentation of vital signs,and FHR which are stable.  Patient reminded not to try to ambulate with numb legs, and that an RN must be present when she attempts to get up.

## 2014-06-24 NOTE — Lactation Note (Signed)
This note was copied from the chart of Casey Dennis. Lactation Consultation Note  Patient Name: Casey Dennis NMMHW'K Date: 06/24/2014 Reason for consult: Initial assessment Baby 59 hours old. Mom states that she is trying to latch baby but baby is not cooperating. Assisted mom to latch baby in football position to left breast. Baby latched deeply, suckling rhythmically in bursts, but keeps losing the deep latch. Assessed baby's mouth and parents confirmed that baby has been sucking her tongue. Demonstrated suck training with a gloved finger. Enc parents to provide suck training to interrupt the baby from sucking her tongue and just prior to latching the baby at the breast. Demonstrated hand expression, not colostrum visible yet. Enc mom to nurse with cues, and to offer lots of STS. Discussed STS with FOB as well.   Mom given [facility] brochure, aware of OP/BFSG, community resources, and [facility] phone line assistance after D/C. Mom stated that she was having back pain, 8/10, so alerted patient's RN Raquel Sarna.  Maternal Data Has patient been taught Hand Expression?: Yes Does the patient have breastfeeding experience prior to this delivery?: No  Feeding Feeding Type: Breast Fed Length of feed: 2 min  LATCH Score/Interventions Latch: Too sleepy or reluctant, no latch achieved, no sucking elicited. Intervention(s): Skin to skin;Teach feeding cues  Audible Swallowing: None  Type of Nipple: Everted at rest and after stimulation  Comfort (Breast/Nipple): Soft / non-tender     Hold (Positioning): Assistance needed to correctly position infant at breast and maintain latch. Intervention(s): Breastfeeding basics reviewed;Support Pillows;Position options;Skin to skin  LATCH Score: 5  Lactation Tools Discussed/Used     Consult Status Consult Status: Follow-up Date: 06/25/14 Follow-up type: In-patient    Inocente Salles 06/24/2014, 1:05 PM

## 2014-06-24 NOTE — Anesthesia Postprocedure Evaluation (Signed)
  Anesthesia Post-op Note  Patient: Casey Dennis  Procedure(s) Performed: * No procedures listed *  Patient Location: Mother/Baby  Anesthesia Type:Epidural  Level of Consciousness: awake, alert , oriented and patient cooperative  Airway and Oxygen Therapy: Patient Spontanous Breathing  Post-op Pain: none  Post-op Assessment: Post-op Vital signs reviewed, Patient's Cardiovascular Status Stable, Respiratory Function Stable, Patent Airway, No headache, No backache, No residual numbness and No residual motor weakness  Post-op Vital Signs: Reviewed and stable  Last Vitals:  Filed Vitals:   06/24/14 0957  BP: 107/51  Pulse: 79  Temp: 36.7 C  Resp: 18    Complications: No apparent anesthesia complications

## 2014-06-25 LAB — CBC
HEMATOCRIT: 26.3 % — AB (ref 36.0–46.0)
HEMOGLOBIN: 9.1 g/dL — AB (ref 12.0–15.0)
MCH: 27.2 pg (ref 26.0–34.0)
MCHC: 34.6 g/dL (ref 30.0–36.0)
MCV: 78.7 fL (ref 78.0–100.0)
PLATELETS: 186 10*3/uL (ref 150–400)
RBC: 3.34 MIL/uL — AB (ref 3.87–5.11)
RDW: 14.3 % (ref 11.5–15.5)
WBC: 15.2 10*3/uL — ABNORMAL HIGH (ref 4.0–10.5)

## 2014-06-25 NOTE — Progress Notes (Signed)
Post Partum Day 1 Subjective: no complaints, up ad lib, voiding and tolerating PO  Objective: Blood pressure 91/61, pulse 64, temperature 97.6 F (36.4 C), temperature source Oral, resp. rate 16, height 5' 1.5" (1.562 m), weight 68.493 kg (151 lb), SpO2 100 %, unknown if currently breastfeeding.  Physical Exam:  General: alert, cooperative and appears stated age Lochia: appropriate Uterine Fundus: firm DVT Evaluation: No evidence of DVT seen on physical exam.   Recent Labs  06/23/14 2315 06/25/14 0535  HGB 11.3* 9.1*  HCT 33.1* 26.3*    Assessment/Plan: Plan for discharge tomorrow and Breastfeeding   LOS: 2 days   Paulanthony Gleaves H. 06/25/2014, 11:10 AM

## 2014-06-25 NOTE — Progress Notes (Signed)
Checked in with patient.  Patient reports pain is "still the same"; however, patient appears calmer and is up and moving around in room. Asked questions about feeding baby.  Can readily express colostrum now and feels more confident that baby is getting it when at the breast. Will assess pain within hour.

## 2014-06-25 NOTE — Progress Notes (Addendum)
Dr Harrington Challenger (on call) notified of patient's complaint of not feeling well, of increased temp (99.8), increased HR (110) and back pain.  Patient given 2 percocet for back pain of 9 at 2034. No headache or tingling in legs; postpartum assessment of uterus wnl. No new orders.

## 2014-06-26 ENCOUNTER — Ambulatory Visit: Payer: Self-pay

## 2014-06-26 NOTE — Progress Notes (Signed)
Patient is eating, ambulating, voiding.  Pain control is good.  Filed Vitals:   06/25/14 2035 06/26/14 0025 06/26/14 0211 06/26/14 0623  BP: 121/76  102/54 102/54  Pulse: 110 106 82 73  Temp: 99.8 F (37.7 C) 98.3 F (36.8 C) 98.7 F (37.1 C) 98 F (36.7 C)  TempSrc: Oral  Oral Oral  Resp: 20   18  Height:      Weight:      SpO2: 100% 100%  99%    Fundus firm Perineum without swelling.  Lab Results  Component Value Date   WBC 15.2* 06/25/2014   HGB 9.1* 06/25/2014   HCT 26.3* 06/25/2014   MCV 78.7 06/25/2014   PLT 186 06/25/2014    --/--/A POS, A POS (06/06 2315)/RI  A/P Post partum day 2- feeling better.  Routine care.  Expect d/c today.    Vanna Shavers A

## 2014-06-26 NOTE — Lactation Note (Signed)
This note was copied from the chart of Casey Dennis. Lactation Consultation Note  Patient Name: Casey Domino Holten OZDGU'Y Date: 06/26/2014 Reason for consult: Follow-up assessment (3% weight loss, Breast and bottle, RNstarted a NS #24 This am . )  Per mom the NS worked well. Per mom called [facility] for a apt on 6/20 to obtain formula. LC informed mom [place] doesn't give  Formula and a pump. Per mom that's ok I will get the pump instead.  Baby recently fed at the  Breast per mom and per [facility] RN with the NS . Mom also aware of the curved tip syringe to instill EBM of formula  Into the top for the baby to get an appetizer.  LC reviewed LC [plan with NS and extra [pumping. Latching steps - breast massage , hand express, pre-pump if needed ( if over full ) , apply NS and instill EBM or formula into the top for an appetizer  And to enhance the baby getting into a consistent pattern. After feeding ( average time 15 -20 mins ) if in a consistent swallowing pattern , let the baby finish  And offer 2nd breast breast , if the baby doesn't latch 2nd breast , release down if full to comfort.  Due to using the NS for latching - post pump after 4-6 feedings a day for 10 -15 mins. To make up for the baby getting so many bottles in the 1st 48 hours of life. Mom denies soreness, instructed on the use shells for semi compressible areolas. Also reviewed prevention and tx of sore nipples and engorgement. Referring  To the Baby and me booklet pages 24.  [place] called [facility] , and spoke to [place] and was able to obtain apt for 1330 today to pick up the DEBP , Kit given before D/C. Mom and dad agreeable to [facility] O/P apt on Wed. June 15 @ 230 pm. Apt reminder given with instructions. Mother informed of post-discharge support and given phone number to the lactation department, including services for phone call assistance;  out-patient appointments; and breastfeeding support group. List of other breastfeeding resources in the  community given in the handout. Encouraged mother to  call for problems or concerns related to breastfeeding.   Maternal Data    Feeding Feeding Type:  (per mom baby fed well with NS recently and supplemented afterwards )  LATCH Score/Interventions Latch: Grasps breast easily, tongue down, lips flanged, rhythmical sucking. Intervention(s): Skin to skin  Audible Swallowing: None (Formula only)  Type of Nipple: Everted at rest and after stimulation  Comfort (Breast/Nipple): Soft / non-tender     Hold (Positioning): Assistance needed to correctly position infant at breast and maintain latch. Intervention(s): Breastfeeding basics reviewed  LATCH Score: 7  Lactation Tools Discussed/Used Tools: Shells;Pump Nipple shield size: 24;Other (comment);20 (per [facility] RN , #24 fit better ) Shell Type: Inverted Breast pump type: Double-Electric Breast Pump (mom will be going to 130 pm [facility]IC apt to obtain a DEBP ) WIC Program: Yes Pump Review: Milk Storage   Consult Status Consult Status: Follow-up Date: 07/02/14 (at 230 pm [facility] LC O/P apt ) Follow-up type: Out-patient    Myer Haff 06/26/2014, 12:34 PM

## 2014-06-26 NOTE — Discharge Summary (Signed)
Obstetric Discharge Summary Reason for Admission: onset of labor Prenatal Procedures: none Intrapartum Procedures: spontaneous vaginal delivery Postpartum Procedures: none Complications-Operative and Postpartum: 1 degree perineal laceration HEMOGLOBIN  Date Value Ref Range Status  06/25/2014 9.1* 12.0 - 15.0 g/dL Final   HCT  Date Value Ref Range Status  06/25/2014 26.3* 36.0 - 46.0 % Final     Discharge Diagnoses: Term Pregnancy-delivered  Discharge Information: Date: 06/26/2014 Activity: pelvic rest Diet: routine Medications: Ibuprofen and Iron Condition: stable Instructions: refer to practice specific booklet Discharge to: home Follow-up Information    Follow up with Olga Millers, MD In 4 weeks.   Specialty:  Obstetrics and Gynecology   Contact information:   [place] [id number] [phone]       Newborn Data: Live born female  Birth Weight: 6 lb 4.7 oz (2855 g) APGAR: 9, 9  Home with mother.  Tove Wideman A 06/26/2014, 7:49 AM

## 2014-06-26 NOTE — Progress Notes (Addendum)
Patient reports feeling sadness (not toward baby, just general) and crying intermittantly. Believes it is her hormones; has been holding baby and interacting.-behavior appropriate. Satwith patient and provided comfort Patient reports having little sleep, very tired.  Has been reporting pain of 8 and 9 in back and hips; is relieved by medication. VS WNL.    Patient appears a little overwhelmed, but calm

## 2014-06-26 NOTE — Progress Notes (Signed)
  CLINICAL SOCIAL WORK MATERNAL/CHILD NOTE  Patient Details  Name: Casey Dennis MRN: [id number] Date of Birth: 06/24/2015  Date:  06/26/2014  Clinical Social Worker Initiating Note:  Lucita Ferrara, LCSW Date/ Time Initiated:  06/26/14/1033     Child's Name:  Casey Dennis   Legal Guardian:  Torra Vanrossum and Lavone Nian (parents)  Need for Interpreter:  None   Date of Referral:  06/26/14     Reason for Referral:  RN noted that MOB is tired and overwhelmed.   Referral Source:  [facility]   Address:  [address] [place], [place] [address]  Phone number:  [phone]   Household Members:  Significant Other   Natural Supports (not living in the home):  Extended Family, Immediate Family   Professional Supports: None   Financial Resources:  Medicaid   Other Resources:    N/A  Cultural/Religious Considerations Which May Impact Care:  None reported  Strengths:  Ability to meet basic needs , Pediatrician chosen , Home prepared for child    Risk Factors/Current Problems:   1) MOB was emotional and tearful on 6/8.  MOB endorsed onset of the "Baby Blues". She did not identify any negative core beliefs about herself as she transitions to motherhood.   Cognitive State:  Able to Concentrate , Alert , Linear Thinking , Goal Oriented    Mood/Affect:  Overwhelmed , Euthymic    CSW Assessment:  CSW received consult due to MOB presenting as sad and overwhelmed related to becoming a new mother.  MOB was observed to be feeding the infant when CSW arrived to their room.  MOB and FOB presented as tired, overwhelmed, and eager for discharge.  MOB and FOB endorsed feeling tired and ready to go home in order to rest and be in their own home environment.  MOB and FOB confirmed having a strong support system that will provide them with additional support in order to ensure that they have opportunities to rest.    MOB discussed feeling the "baby blues" the previous evening. She stated that  she does not believe anything triggered her emotions, but shared that she went into the bathroom and started to cry.  MOB denied presence of any negative core beliefs about herself connected with her emotions. She shared belief that it was a combination of being tired, overwhelmed, and the changes in hormones.  MOB did not discuss the event in further details, but stated that her sisters have spoken openly to her about postpartum depression.  CSW reviewed education, and also discussed common symptoms of postpartum anxiety.  CSW encouraged MOB to reach out to her providers prior to her 6 week check up if she notes symptoms.  MOB and FOB acknowledged the statement and agreed to contact her providers. CSW reviewed strategies in upcoming weeks that also assist to care for MOB's mental health.   MOB and FOB denied additional questions, concerns, or needs at this time.   CSW Plan/Description:   1)Patient/Family Education: Perinatal mood and anxiety disorders 2)No Further Intervention Required/No Barriers to Discharge    Sheilah Mins, LCSW 06/26/2014, 11:01 AM

## 2014-07-02 ENCOUNTER — Ambulatory Visit (HOSPITAL_COMMUNITY): Payer: Medicaid Other

## 2014-11-19 ENCOUNTER — Encounter (HOSPITAL_COMMUNITY): Payer: Self-pay

## 2014-11-19 ENCOUNTER — Emergency Department (HOSPITAL_COMMUNITY)
Admission: EM | Admit: 2014-11-19 | Discharge: 2014-11-19 | Disposition: A | Discharge status: Home / Self Care | Payer: Medicaid Other | Attending: Emergency Medicine | Admitting: Emergency Medicine

## 2014-11-19 DIAGNOSIS — S0501XA Injury of conjunctiva and corneal abrasion without foreign body, right eye, initial encounter: Secondary | ICD-10-CM

## 2014-11-19 DIAGNOSIS — Y9289 Other specified places as the place of occurrence of the external cause: Secondary | ICD-10-CM | POA: Insufficient documentation

## 2014-11-19 DIAGNOSIS — X58XXXA Exposure to other specified factors, initial encounter: Secondary | ICD-10-CM | POA: Insufficient documentation

## 2014-11-19 DIAGNOSIS — Z79899 Other long term (current) drug therapy: Secondary | ICD-10-CM | POA: Insufficient documentation

## 2014-11-19 DIAGNOSIS — H1131 Conjunctival hemorrhage, right eye: Secondary | ICD-10-CM

## 2014-11-19 DIAGNOSIS — Y998 Other external cause status: Secondary | ICD-10-CM | POA: Insufficient documentation

## 2014-11-19 DIAGNOSIS — Y9389 Activity, other specified: Secondary | ICD-10-CM | POA: Insufficient documentation

## 2014-11-19 MED ORDER — FLUORESCEIN SODIUM 1 MG OP STRP
1.0000 | ORAL_STRIP | Freq: Once | OPHTHALMIC | Status: AC
  Administered 2014-11-19: 1 via OPHTHALMIC
  Filled 2014-11-19: qty 1

## 2014-11-19 MED ORDER — PROPARACAINE HCL 0.5 % OP SOLN
1.0000 [drp] | Freq: Once | OPHTHALMIC | Status: AC
  Administered 2014-11-19: 1 [drp] via OPHTHALMIC
  Filled 2014-11-19: qty 15

## 2014-11-19 MED ORDER — ERYTHROMYCIN 5 MG/GM OP OINT
1.0000 "application " | TOPICAL_OINTMENT | Freq: Once | OPHTHALMIC | Status: AC
  Administered 2014-11-19: 1 via OPHTHALMIC
  Filled 2014-11-19: qty 3.5

## 2014-11-19 MED ORDER — ERYTHROMYCIN 5 MG/GM OP OINT: 1.0000 "application " | TOPICAL_OINTMENT | Freq: Four times a day (QID) | OPHTHALMIC | Status: DC

## 2014-11-19 NOTE — Discharge Instructions (Signed)
Subconjunctival Hemorrhage °Subconjunctival hemorrhage is bleeding that happens between the white part of your eye (sclera) and the clear membrane that covers the outside of your eye (conjunctiva). There are many tiny blood vessels near the surface of your eye. A subconjunctival hemorrhage happens when one or more of these vessels breaks and bleeds, causing a red patch to appear on your eye. This is similar to a bruise. °Depending on the amount of bleeding, the red patch may only cover a small area of your eye or it may cover the entire visible part of the sclera. If a lot of blood collects under the conjunctiva, there may also be swelling. Subconjunctival hemorrhages do not affect your vision or cause pain, but your eye may feel irritated if there is swelling. Subconjunctival hemorrhages usually do not require treatment, and they disappear on their own within two weeks. °CAUSES °This condition may be caused by: °· Mild trauma, such as rubbing your eye too hard. °· Severe trauma or blunt injuries. °· Coughing, sneezing, or vomiting. °· Straining, such as when lifting a heavy object. °· High blood pressure. °· Recent eye surgery. °· A history of diabetes. °· Certain medicines, especially blood thinners (anticoagulants). °· Other conditions, such as eye tumors, bleeding disorders, or blood vessel abnormalities. °Subconjunctival hemorrhages can happen without an obvious cause.  °SYMPTOMS  °Symptoms of this condition include: °· A bright red or dark red patch on the white part of the eye. °¨ The red area may spread out to cover a larger area of the eye before it goes away. °¨ The red area may turn brownish-yellow before it goes away. °· Swelling. °· Mild eye irritation. °DIAGNOSIS °This condition is diagnosed with a physical exam. If your subconjunctival hemorrhage was caused by trauma, your health care provider may refer you to an eye specialist (ophthalmologist) or another specialist to check for other injuries. You  may have other tests, including: °· An eye exam. °· A blood pressure check. °· Blood tests to check for bleeding disorders. °If your subconjunctival hemorrhage was caused by trauma, X-rays or a CT scan may be done to check for other injuries. °TREATMENT °Usually, no treatment is needed. Your health care provider may recommend eye drops or cold compresses to help with discomfort. °HOME CARE INSTRUCTIONS °· Take over-the-counter and prescription medicines only as directed by your health care provider. °· Use eye drops or cold compresses to help with discomfort as directed by your health care provider. °· Avoid activities, things, and environments that may irritate or injure your eye. °· Keep all follow-up visits as told by your health care provider. This is important. °SEEK MEDICAL CARE IF: °· You have pain in your eye. °· The bleeding does not go away within 3 weeks. °· You keep getting new subconjunctival hemorrhages. °SEEK IMMEDIATE MEDICAL CARE IF: °· Your vision changes or you have difficulty seeing. °· You suddenly develop severe sensitivity to light. °· You develop a severe headache, persistent vomiting, confusion, or abnormal tiredness (lethargy). °· Your eye seems to bulge or protrude from your eye socket. °· You develop unexplained bruises on your body. °· You have unexplained bleeding in another area of your body. °  °This information is not intended to replace advice given to you by your health care provider. Make sure you discuss any questions you have with your health care provider. °  °Document Released: 01/03/2005 Document Revised: 09/24/2014 Document Reviewed: 03/12/2014 °Elsevier Interactive Patient Education ©2016 [employer] ° °

## 2014-11-19 NOTE — ED Notes (Signed)
Pt was rubbing her right eye and then after her friend noticed a red spot to conjunctiva. Denies blurry vision or vision changes.

## 2014-11-19 NOTE — ED Notes (Signed)
Bedside signature pad was not working. Pt given AVS, prescriptions reviewed, and questions answered.

## 2014-11-19 NOTE — ED Provider Notes (Signed)
CSN: [id number]     Arrival date & time 11/19/14  0133 History  By signing my name below, I, Soijett Blue, attest that this documentation has been prepared under the direction and in the presence of Sharlett Iles, MD. Electronically Signed: Soijett Blue, ED Scribe. 11/19/2014. 2:42 AM.   Chief Complaint  Patient presents with  . Eye Injury      The history is provided by the patient. No language interpreter was used.    HPI Comments: Casey Dennis is a 24 y.o. female who presents to the Emergency Department complaining of right eye redness onset 1 hour ago PTA. She rubbed her eye and then noticed a red spot in her eye. She has a mild foreign body sensation but denies any severe pain. She states that she has tried eye drops given in the ED with mild relief for her symptoms. She denies vision disturbance, and any other symptoms. Denies medical issues, daily medications, or bleeding disorder. Denies family hx of bleeding disorder.    Past Medical History  Diagnosis Date  . Medical history non-contributory    Past Surgical History  Procedure Laterality Date  . No past surgeries     Family History  Problem Relation Age of Onset  . Cancer Father    Social History  Substance Use Topics  . Smoking status: Never Smoker   . Smokeless tobacco: Never Used  . Alcohol Use: No   OB History    Gravida Para Term Preterm AB TAB SAB Ectopic Multiple Living   2 1 1  1 1    0 1     Review of Systems  10 Systems reviewed and all are negative for acute change except as noted in the HPI.   Allergies  Review of patient's allergies indicates no known allergies.  Home Medications   Prior to Admission medications   Medication Sig Start Date End Date Taking? Authorizing Provider  acetaminophen (TYLENOL) 500 MG tablet Take 1,000 mg by mouth every 6 (six) hours as needed for mild pain, moderate pain or headache.     Historical Provider, MD  calcium carbonate (TUMS - DOSED IN MG ELEMENTAL  CALCIUM) 500 MG chewable tablet Chew 2 tablets by mouth daily as needed for indigestion or heartburn.    Historical Provider, MD  ferrous sulfate 325 (65 FE) MG tablet Take 325 mg by mouth daily with breakfast.    Historical Provider, MD  Prenatal Vit-Fe Fumarate-FA (PRENATAL MULTIVITAMIN) TABS tablet Take 2 tablets by mouth daily at 12 noon.     Historical Provider, MD   BP 117/85 mmHg  Pulse 73  Temp(Src) 99.4 F (37.4 C) (Oral)  Resp 18  SpO2 98%  LMP 11/12/2014 Physical Exam  Constitutional: She is oriented to person, place, and time. She appears well-developed and well-nourished. No distress.  HENT:  Head: Normocephalic and atraumatic.  Eyes: EOM are normal. Right conjunctiva has a hemorrhage. Left conjunctiva has no hemorrhage.  Right eye lateral subconjunctival hemorrhage with sparing of iris. Punctate fluorescein uptake over area of hemorrhage.   Neck: Neck supple.  Cardiovascular: Normal rate.   Pulmonary/Chest: Effort normal. No respiratory distress.  Musculoskeletal: Normal range of motion.  Neurological: She is alert and oriented to person, place, and time.  Skin: Skin is warm and dry.  Psychiatric: She has a normal mood and affect. Her behavior is normal.  Nursing note and vitals reviewed.   ED Course  Procedures (including critical care time) DIAGNOSTIC STUDIES: Oxygen Saturation is 98% on  RA, nl by my interpretation.    COORDINATION OF CARE: 2:25 AM Discussed treatment plan with pt at bedside which includes abx eye ointment and pt agreed to plan.  Medications  fluorescein ophthalmic strip 1 strip (1 strip Both Eyes Given 11/19/14 0213)  proparacaine (ALCAINE) 0.5 % ophthalmic solution 1 drop (1 drop Both Eyes Given 11/19/14 0214)  erythromycin ophthalmic ointment 1 application (1 application Right Eye Given 11/19/14 0306)     MDM   Final diagnoses:  Subconjunctival hemorrhage, right  Corneal abrasion, right, initial encounter   Patient presents with right  eye redness after rubbing her eye and incidentally noting redness afterwards. Patient had subconjunctival hemorrhage on exam. A few punctate areas of fluorescein uptake over area of hemorrhage. Provided erythromycin ointment to treat corneal abrasion and instructed on expected resolution of spontaneous subconjunctival hemorrhage. Reviewed return precautions including any visual changes, eye swelling, or eye discharge. Patient voiced understanding and was discharged in satisfactory condition.  I personally performed the services described in this documentation, which was scribed in my presence. The recorded information has been reviewed and is accurate.    Sharlett Iles, MD 11/20/14 [phone]

## 2015-06-13 ENCOUNTER — Emergency Department (HOSPITAL_COMMUNITY)
Admission: EM | Admit: 2015-06-13 | Discharge: 2015-06-13 | Disposition: A | Discharge status: Home / Self Care | Payer: Medicaid Other | Attending: Emergency Medicine | Admitting: Emergency Medicine

## 2015-06-13 ENCOUNTER — Encounter (HOSPITAL_COMMUNITY): Payer: Self-pay | Admitting: Emergency Medicine

## 2015-06-13 DIAGNOSIS — Y9389 Activity, other specified: Secondary | ICD-10-CM | POA: Insufficient documentation

## 2015-06-13 DIAGNOSIS — T7840XA Allergy, unspecified, initial encounter: Secondary | ICD-10-CM | POA: Diagnosis present

## 2015-06-13 DIAGNOSIS — X58XXXA Exposure to other specified factors, initial encounter: Secondary | ICD-10-CM | POA: Insufficient documentation

## 2015-06-13 DIAGNOSIS — Y998 Other external cause status: Secondary | ICD-10-CM | POA: Diagnosis not present

## 2015-06-13 DIAGNOSIS — Y9289 Other specified places as the place of occurrence of the external cause: Secondary | ICD-10-CM | POA: Insufficient documentation

## 2015-06-13 DIAGNOSIS — R51 Headache: Secondary | ICD-10-CM | POA: Insufficient documentation

## 2015-06-13 NOTE — ED Notes (Signed)
The patient said she was having a headahce, took topiramate that her co-worker gave her and she thinks she is having an allergic reaction.  The patient has no rash, no sob but is complaining of dizziness, was sob earlier and numb.  The patient is not having any pain at this time.

## 2015-06-13 NOTE — ED Notes (Signed)
Pt decided not to wait. She left.

## 2015-06-14 ENCOUNTER — Encounter (HOSPITAL_COMMUNITY): Payer: Self-pay

## 2015-06-14 ENCOUNTER — Emergency Department (HOSPITAL_COMMUNITY)
Admission: EM | Admit: 2015-06-14 | Discharge: 2015-06-14 | Discharge status: Against Medical Advice | Payer: Medicaid Other | Attending: Emergency Medicine | Admitting: Emergency Medicine

## 2015-06-14 DIAGNOSIS — T50905A Adverse effect of unspecified drugs, medicaments and biological substances, initial encounter: Secondary | ICD-10-CM | POA: Diagnosis not present

## 2015-06-14 DIAGNOSIS — R200 Anesthesia of skin: Secondary | ICD-10-CM | POA: Diagnosis present

## 2015-06-14 NOTE — ED Provider Notes (Signed)
CSN: [id number]     Arrival date & time 06/14/15  1335 History   First MD Initiated Contact with Patient 06/14/15 1444     Chief Complaint  Patient presents with  . Numbness    HPI  Casey Dennis is an 25 y.o. female with no significant PMH who presents to the ED for evaluation of multiple complaints she suspects is a medication reaction. She states that yesterday she had a headache at work so she took two tabs of her co-worker's Topamax of unknown dosage. She states that yesterday immediately after she took the medicine she started feeling dizzy with some shortness of breath and numbness/tingling in her face. She states that the dizziness and SOB resolved but this morning she woke up feeling very depressed and forgetful. She states she has some tingling/numbness in her lips/face and she feels like her throat is a little bit itchy. She states that she is tired and sleepy. States she has no appetite today. She is worried she is having an allergic reaction. She denies lip swelling, tongue swelling, new rashes, difficulty breathing. Denies LOC. Denies fever, chills, n/v/d, chest pain.   Past Medical History  Diagnosis Date  . Medical history non-contributory    Past Surgical History  Procedure Laterality Date  . No past surgeries     Family History  Problem Relation Age of Onset  . Cancer Father    Social History  Substance Use Topics  . Smoking status: Never Smoker   . Smokeless tobacco: Never Used  . Alcohol Use: No   OB History    Gravida Para Term Preterm AB TAB SAB Ectopic Multiple Living   2 1 1  1 1    0 1     Review of Systems  All other systems reviewed and are negative.     Allergies  Review of patient's allergies indicates no known allergies.  Home Medications   Prior to Admission medications   Medication Sig Start Date End Date Taking? Authorizing Provider  acetaminophen (TYLENOL) 500 MG tablet Take 1,000 mg by mouth every 6 (six) hours as needed for mild pain,  moderate pain or headache.     Historical Provider, MD  calcium carbonate (TUMS - DOSED IN MG ELEMENTAL CALCIUM) 500 MG chewable tablet Chew 2 tablets by mouth daily as needed for indigestion or heartburn.    Historical Provider, MD  erythromycin ophthalmic ointment Place 1 application into the right eye 4 (four) times daily. Place 1/2 inch ribbon of ointment in the affected eye 4 times a day for 3-5 days Patient not taking: Reported on 06/14/2015 11/19/14   Sharlett Iles, MD  ferrous sulfate 325 (65 FE) MG tablet Take 325 mg by mouth daily with breakfast.    Historical Provider, MD  Prenatal Vit-Fe Fumarate-FA (PRENATAL MULTIVITAMIN) TABS tablet Take 2 tablets by mouth daily at 12 noon.     Historical Provider, MD   BP 118/86 mmHg  Pulse 72  Temp(Src) 97.6 F (36.4 C) (Oral)  Resp 20  SpO2 100%  LMP 06/13/2015 Physical Exam  Constitutional: She is oriented to person, place, and time. No distress.  HENT:  Head: Atraumatic.  Right Ear: External ear normal.  Left Ear: External ear normal.  Nose: Nose normal.  Uvula midline No trismus Or posterior oropharyngeal erythema or edema No drooling, tolerating secretions  Eyes: Conjunctivae are normal. No scleral icterus.  Neck: Normal range of motion. Neck supple.  Cardiovascular: Normal rate, regular rhythm and normal heart sounds.  Pulmonary/Chest: Effort normal and breath sounds normal. No respiratory distress. She has no wheezes.  Lungs CTAB  Abdominal: Soft. She exhibits no distension. There is no tenderness.  Lymphadenopathy:    She has no cervical adenopathy.  Neurological: She is alert and oriented to person, place, and time. No cranial nerve deficit.  5/5 strength throughout Normal finger to nose No pronator drift  Skin: Skin is warm and dry. She is not diaphoretic.  No rashes  Psychiatric: Her behavior is normal.  Flat affect  Nursing note and vitals reviewed.   ED Course  Procedures (including critical care  time) Labs Review Labs Reviewed  I-STAT BETA HCG BLOOD, ED ([facility], [facility], [facility] ONLY)  I-STAT CHEM 8, ED    Imaging Review No results found. I have personally reviewed and evaluated these images and lab results as part of my medical decision-making.   EKG Interpretation None      MDM   Final diagnoses:  Medication reaction, initial encounter    I suspect pt is experiencing many of the adverse effects of topamax though not having a true allergic reaction. She has no angioedema, is breathing comfortably, and tolerating her secretions. Her exam is completely unremarkable other than her flat affect. I did recommend we could do some basic labs and a pregnancy test. However, after initial evaluation pt is now refusing labs and says that we do not care what she is saying, and she does not want any further treatment and wants to leave. I sat and spoke with her at length again and encouraged her to stay for basic labs at least, again reiterating that while I suspect her symptoms are an adverse reaction to the topamax I would recommend ruling out metabolic derangement or other process. She again refuses and wants to leave. I did instruct her to return to the ER for new/worsening symptoms and provided a resource guide for PCP follow up prior to her leaving.    Anne Ng, PA-C 06/14/15 [place], MD 06/14/15 [phone]

## 2015-06-14 NOTE — Discharge Instructions (Signed)
You were seen in the emergency room today for evaluation of multiple complaints. I do suspect you had an adverse reaction to the Topamax (topiramate) but I do not think you had a true allergic reaction. Common side effects of the medicine you took include dizziness, drowsiness, memory impairment, mood issues, and decreased appetite. These are all symptoms you described today. You declined any further evaluation in the emergency room today.   Return to the emergency room for worsening condition or new concerning symptoms. Follow up with your regular doctor. If you don't have a regular doctor use one of the numbers below to establish a primary care doctor.   Emergency Department Resource Guide 1) Find a Doctor and Pay Out of Pocket Although you won't have to find out who is covered by your insurance plan, it is a good idea to ask around and get recommendations. You will then need to call the office and see if the doctor you have chosen will accept you as a new patient and what types of options they offer for patients who are self-pay. Some doctors offer discounts or will set up payment plans for their patients who do not have insurance, but you will need to ask so you aren't surprised when you get to your appointment.  2) Contact Your Local Health Department Not all health departments have doctors that can see patients for sick visits, but many do, so it is worth a call to see if yours does. If you don't know where your local health department is, you can check in your phone book. The CDC also has a tool to help you locate your state's health department, and many state websites also have listings of all of their local health departments.  3) Find a [facility] If your illness is not likely to be very severe or complicated, you may want to try a walk in clinic. These are popping up all over the country in pharmacies, drugstores, and shopping centers. They're usually staffed by nurse practitioners or  physician assistants that have been trained to treat common illnesses and complaints. They're usually fairly quick and inexpensive. However, if you have serious medical issues or chronic medical problems, these are probably not your best option.  No Primary Care Doctor: - Call Health Connect at  [phone] - they can help you locate a primary care doctor that  accepts your insurance, provides certain services, etc. - Physician Referral [phone]  Emergency Department Resource Guide 1) Find a Doctor and Pay Out of Pocket Although you won't have to find out who is covered by your insurance plan, it is a good idea to ask around and get recommendations. You will then need to call the office and see if the doctor you have chosen will accept you as a new patient and what types of options they offer for patients who are self-pay. Some doctors offer discounts or will set up payment plans for their patients who do not have insurance, but you will need to ask so you aren't surprised when you get to your appointment.  2) Contact Your Local Health Department Not all health departments have doctors that can see patients for sick visits, but many do, so it is worth a call to see if yours does. If you don't know where your local health department is, you can check in your phone book. The CDC also has a tool to help you locate your state's health department, and many state websites also have listings of  all of their local health departments.  3) Find a [facility] If your illness is not likely to be very severe or complicated, you may want to try a walk in clinic. These are popping up all over the country in pharmacies, drugstores, and shopping centers. They're usually staffed by nurse practitioners or physician assistants that have been trained to treat common illnesses and complaints. They're usually fairly quick and inexpensive. However, if you have serious medical issues or chronic medical problems,  these are probably not your best option.  No Primary Care Doctor: - Call Health Connect at  [phone] - they can help you locate a primary care doctor that  accepts your insurance, provides certain services, etc. - Physician Referral Service- [phone]  Chronic Pain Problems: Organization         Address  Phone   Notes  [facility]  [phone] Patients need to be referred by their primary care doctor.   Medication Assistance: Organization         Address  Phone   Notes  [facility] Medication [facility] [address] [place], [place] [address] [phone] --Must be a resident of [facility] -- Must have NO insurance coverage whatsoever (no Medicaid/ Medicare, etc.) -- The pt. MUST have a primary care doctor that directs their care regularly and follows them in the community   [facility]  [phone]   [employer]  [phone]    Agencies that provide inexpensive medical care: Organization         Address  Phone   Notes  [place]  [phone]   [facility]    [phone]   [facility] [place], [place] [address] [phone]   [place] [address], [place] [phone]   [facility]anned Parenthood    [phone]   [facility]    [phone]   [place] [address], [place] Phone:  [phone], Fax:  [phone] Hours of Operation:  9 am - 6 pm, M-F.  Also accepts Medicaid/Medicare and self-pay.  [facility] for [place], [address], [place] Phone: [phone], Fax: [phone]. Hours of Operation:  8:30 am - 5:30 pm, M-F.  Also accepts Medicaid and self-pay.  [facility] [address], [place] Phone: [phone]   [place], [place], [place] [phone], Ext. [phone] Mondays &  Thursdays: 7-9 AM.  First 15 patients are seen on a first come, first serve basis.    [place] Providers:  Organization         Address  Phone   Notes  [facility] [address], [place] [phone] Also accepts self-pay patients.  [facility] [id number] [place],   [phone]   [place], [address], [place] [phone]   [facility] [address] [place] [phone]   Lucianne Lei [address], [place]   [phone] Only accepts [place] [facility] [place] patients after they have their name applied to their card.   Self-Pay (no insurance) in [facility]:  Patent attorney   Notes  Sickle Cell Patients, [facility] [place] [phone]   [facility] [place] [phone]   [facility] [place]  [address], [place] [phone]   [facility]/Dr. Osei-Bonsu  [address], [place] or [address], [place] [phone] Phone number for both [place] and [place] locations is the same.  [facility] [address], [place] [phone]   [facility] [address], [place] or [address] [phone]   [facility] [address] [place] [phone], phone; [phone], fax Sees patients 1st and 3rd Saturday of every month.  Must not qualify for public or private insurance (i.e. Medicaid, Medicare, [place] Health Choice, Veterans' Benefits)  Household income should be no more than 200% of the poverty level The clinic cannot treat you if you are pregnant or think you are pregnant  Sexually transmitted diseases are not treated at the  clinic.

## 2015-06-14 NOTE — ED Notes (Signed)
Pt reports last night at work she had a headache and took coworkers Topiramate x 2 tabs, after an hour pt started feeling forgetful, numb and tingling all over body.  Symptoms lasted an hour.  Pt woke up this morning depressed and numbness/tingling in face, decreased appetite.

## 2015-06-14 NOTE — ED Notes (Signed)
Patient reports taking a coworkers topamax last night for headache, since taking the medication reports adverse signs. Reports numbness tingling to hands and feet, itchy throat, forget-fullness, and depression. Airway is intact. No signs of allergic reaction.

## 2015-06-16 ENCOUNTER — Ambulatory Visit (INDEPENDENT_AMBULATORY_CARE_PROVIDER_SITE_OTHER): Payer: Self-pay | Admitting: Physician Assistant

## 2015-06-16 VITALS — BP 108/68 | HR 62 | Temp 97.5°F | Resp 16 | Ht 61.0 in | Wt 134.0 lb

## 2015-06-16 DIAGNOSIS — M545 Low back pain, unspecified: Secondary | ICD-10-CM

## 2015-06-16 NOTE — Patient Instructions (Addendum)
Take 600 mg of Ibuprofen every 6 hours as needed for low back pain.     IF you received an x-ray today, you will receive an invoice from [facility]. Please contact [facility] at [phone] with questions or concerns regarding your invoice.   IF you received labwork today, you will receive an invoice from [employer]. Please contact [facility] at [phone] with questions or concerns regarding your invoice.   Our billing staff will not be able to assist you with questions regarding bills from these companies.  You will be contacted with the lab results as soon as they are available. The fastest way to get your results is to activate your My Chart account. Instructions are located on the last page of this paperwork. If you have not heard from [place] regarding the results in 2 weeks, please contact this office.

## 2015-06-18 NOTE — Progress Notes (Signed)
   06/18/2015 8:12 AM   DOB: [birth year] / MRN: [id number]  SUBJECTIVE:  Casey Dennis is a 25 y.o. female presenting for midline LBP that started this morning and is worse with movement.  The pain is mild to moderate.  She has tried Ibuprofen 400 mg with some relief.  Denies dysuria, urgency, frequency, flank pain, nausea, weakness and paresthesia.  No bowel and bladder incontinence.    She has No Known Allergies.   She  has a past medical history of Medical history non-contributory; Allergy; Anemia; Anxiety; and Depression.    She  reports that she has never smoked. She has never used smokeless tobacco. She reports that she does not drink alcohol or use illicit drugs. She  reports that she currently engages in sexual activity. She reports using the following method of birth control/protection: None. The patient  has past surgical history that includes No past surgeries.  Her family history includes Cancer in her father.  ROS  Per HPI  Problem list and medications reviewed and updated by myself where necessary, and exist elsewhere in the encounter.   OBJECTIVE:  BP 108/68 mmHg  Pulse 62  Temp(Src) 97.5 F (36.4 C) (Oral)  Resp 16  Ht 5\' 1"  (1.549 m)  Wt 134 lb (60.782 kg)  BMI 25.33 kg/m2  SpO2 97%  LMP 06/13/2015  Physical Exam  Constitutional: She is oriented to person, place, and time. She appears well-nourished. No distress.  Eyes: EOM are normal. Pupils are equal, round, and reactive to light.  Cardiovascular: Normal rate and regular rhythm.   Pulmonary/Chest: Effort normal and breath sounds normal.  Abdominal: She exhibits no distension.  Neurological: She is alert and oriented to person, place, and time. She has normal strength and normal reflexes. No cranial nerve deficit or sensory deficit. Gait normal.  Skin: Skin is dry. She is not diaphoretic.  Psychiatric: She has a normal mood and affect.  Vitals reviewed.   No results found for this or any previous visit (from  the past 72 hour(s)).  No results found.  ASSESSMENT AND PLAN  [name] was seen today for back pain.  Diagnoses and all orders for this visit:  Midline low back pain without sciatica: No alarm symptoms. Advised Ibuprofen 600 mg 3-4 times daily prn.     The patient was advised to call or return to clinic if she does not see an improvement in symptoms or to seek the care of the closest emergency department if she worsens with the above plan.   Philis Fendt, MHS, PA-C [facility] 06/18/2015 8:12 AM

## 2015-08-06 ENCOUNTER — Ambulatory Visit (INDEPENDENT_AMBULATORY_CARE_PROVIDER_SITE_OTHER): Payer: BLUE CROSS/BLUE SHIELD | Admitting: Family Medicine

## 2015-08-06 VITALS — BP 116/76 | HR 80 | Temp 98.0°F | Resp 18 | Ht 61.0 in | Wt 134.0 lb

## 2015-08-06 DIAGNOSIS — R300 Dysuria: Secondary | ICD-10-CM

## 2015-08-06 DIAGNOSIS — Z30011 Encounter for initial prescription of contraceptive pills: Secondary | ICD-10-CM

## 2015-08-06 DIAGNOSIS — N390 Urinary tract infection, site not specified: Secondary | ICD-10-CM

## 2015-08-06 DIAGNOSIS — Z309 Encounter for contraceptive management, unspecified: Secondary | ICD-10-CM

## 2015-08-06 LAB — POCT URINALYSIS DIP (MANUAL ENTRY)
BILIRUBIN UA: NEGATIVE
Glucose, UA: NEGATIVE
Ketones, POC UA: NEGATIVE
NITRITE UA: NEGATIVE
Protein Ur, POC: 30 — AB
Spec Grav, UA: 1.02
Urobilinogen, UA: 0.2
pH, UA: 5.5

## 2015-08-06 LAB — POCT URINE PREGNANCY: PREG TEST UR: NEGATIVE

## 2015-08-06 LAB — POC MICROSCOPIC URINALYSIS (UMFC): MUCUS RE: ABSENT

## 2015-08-06 MED ORDER — CIPROFLOXACIN HCL 500 MG PO TABS: 500.0000 mg | ORAL_TABLET | Freq: Two times a day (BID) | ORAL | Status: DC

## 2015-08-06 MED ORDER — NORGESTIM-ETH ESTRAD TRIPHASIC 0.18/0.215/0.25 MG-25 MCG PO TABS: 1.0000 | ORAL_TABLET | Freq: Every day | ORAL | Status: DC

## 2015-08-06 NOTE — Patient Instructions (Addendum)
IF you received an x-ray today, you will receive an invoice from [facility]. Please contact [facility] at [phone] with questions or concerns regarding your invoice.   IF you received labwork today, you will receive an invoice from [employer]. Please contact [facility] at [phone] with questions or concerns regarding your invoice.   Our billing staff will not be able to assist you with questions regarding bills from these companies.  You will be contacted with the lab results as soon as they are available. The fastest way to get your results is to activate your My Chart account. Instructions are located on the last page of this paperwork. If you have not heard from [place] regarding the results in 2 weeks, please contact this office.     Hormonal Contraception Information Estrogen and progesterone (progestin) are hormones used in many forms of birth control (contraception). These two hormones make up most hormonal contraceptives. Hormonal contraceptives use either:   A combination of estrogen hormone and progesterone hormone in one of these forms:  Pill--Pills come in various combinations of active hormone pills and nonhormonal pills. Different combinations of pills may give you a period once a month, once every 3 months, or no period at all. It is important to take the pills the same time each day.  Patch--The patch is placed on the lower abdomen every week for 3 weeks. On the fourth week, the patch is not placed.  Vaginal ring--The ring is placed in the vagina and left there for 3 weeks. It is then removed for 1 week.  Progesterone alone in one of these forms:  Pill--Hormone pills are taken every day of the cycle.  Intrauterine device (IUD)--The IUD is inserted during a menstrual period and removed or replaced every 5 years or sooner.  Implant--Plastic rods are placed under the skin of the upper arm. They are removed or replaced  every 3 years or sooner.  Injection--The injection is given once every 90 days. Pregnancy can still occur with any of these hormonal contraceptive methods. If you have any suspicion that you might be pregnant, take a pregnancy test and talk to your health care provider.  ESTROGEN AND PROGESTERONE CONTRACEPTIVES Estrogen and progesterone contraceptives can prevent pregnancy by:  Stopping the release of an egg (ovulation).  Thickening the mucus of the cervix, making it difficult for sperm to enter the uterus.  Changing the lining of the uterus. This change makes it more difficult for an egg to implant. Side effects from estrogen occur more often in the first 2-3 months. Talk to your health care provider about what side effects may affect you. If you develop persistent side effects or they are severe, talk to your health care provider. PROGESTERONE CONTRACEPTIVES Progesterone-only contraceptives can prevent pregnancy by:   Blocking ovulation. This occurs in many women, but some women will continue to ovulate.   Preventing the entry of sperm into the uterus by keeping the cervical mucus thick and sticky.   Changing the lining of the uterus. This change makes it more difficult for an egg to implant.  Side effects of progesterone can vary. Talk to your health care provider about what side effects may affect you. If you develop persistent side effects or they are severe, talk to your health care provider.    This information is not intended to replace advice given to you by your health care provider. Make sure you discuss any questions you have with your health care provider.  Document Released: 01/23/2007 Document Revised: 09/05/2012 Document Reviewed: 06/17/2012 Elsevier Interactive Patient Education 2016 Elsevier Inc. Urinary Tract Infection A urinary tract infection (UTI) can occur any place along the urinary tract. The tract includes the kidneys, ureters, bladder, and urethra. A type  of germ called bacteria often causes a UTI. UTIs are often helped with antibiotic medicine.  HOME CARE   If given, take antibiotics as told by your doctor. Finish them even if you start to feel better.  Drink enough fluids to keep your pee (urine) clear or pale yellow.  Avoid tea, drinks with caffeine, and bubbly (carbonated) drinks.  Pee often. Avoid holding your pee in for a long time.  Pee before and after having sex (intercourse).  Wipe from front to back after you poop (bowel movement) if you are a woman. Use each tissue only once. GET HELP RIGHT AWAY IF:   You have back pain.  You have lower belly (abdominal) pain.  You have chills.  You feel sick to your stomach (nauseous).  You throw up (vomit).  Your burning or discomfort with peeing does not go away.  You have a fever.  Your symptoms are not better in 3 days. MAKE SURE YOU:   Understand these instructions.  Will watch your condition.  Will get help right away if you are not doing well or get worse.   This information is not intended to replace advice given to you by your health care provider. Make sure you discuss any questions you have with your health care provider.   Document Released: 06/22/2007 Document Revised: 01/24/2014 Document Reviewed: 08/04/2011 Elsevier Interactive Patient Education [employer].

## 2015-08-06 NOTE — Progress Notes (Signed)
   Patient ID: Casey Dennis, female    DOB: 10/16/1990, 25 y.o.   MRN: [id number]  PCP: Molli Barrows, FNP  Chief Complaint  Patient presents with  . Urinary Frequency  . Dysuria    Started this morning     Subjective:   HPI Presents for evaluation of urinary frequency, urgency, and irritation.  This morning awaken with frequency, urgency, irritation "tingling" sensation when urinating. No vaginal discharge. Sexually active. Recently married in April.  Declines STD testing today.  Two periods in the same month, ended July 4th both 7 days , used Plan B after unprotected week of July 4th.  Had another period that lasted 7 days and ended July 08/02/15. She is interested starting birth control pills today. No preference on type of pills. Took birth control pills several years ago but stopped due to lack of insurance.  Denies personal history of clotting disorder, adverse reaction to birth control pills, and or episodes of hypertension.    Review of Systems  Constitutional: Negative.   Respiratory: Negative.   Cardiovascular: Negative.   Genitourinary: Positive for dysuria, urgency and frequency. Negative for hematuria, genital sores, menstrual problem and pelvic pain.  Hematological: Negative.        Patient Active Problem List   Diagnosis Date Noted  . Normal labor 06/23/2014     Prior to Admission medications   Not on File     No Known Allergies     Objective:  Physical Exam  Constitutional: She is oriented to person, place, and time. She appears well-developed and well-nourished.  HENT:  Head: Normocephalic.  Right Ear: External ear normal.  Left Ear: External ear normal.  Eyes: Conjunctivae and EOM are normal.  Neck: Normal range of motion. Neck supple.  Cardiovascular: Normal rate, regular rhythm, normal heart sounds and intact distal pulses.   Pulmonary/Chest: Effort normal and breath sounds normal.  Neurological: She is alert and oriented to person,  place, and time. She has normal reflexes.  Skin: Skin is warm and dry.  Psychiatric: She has a normal mood and affect. Her behavior is normal. Judgment and thought content normal.           Assessment & Plan:  .1. Dysuria - POCT urinalysis dipstick - POCT Microscopic Urinalysis ([facility]) - POCT urine pregnancy  2. Urinary tract infection, site not specified  Urine microscopic lab resulted indicative of a likely urinary tract infection.  Ciprofloxacin 500 mg, twice daily, for 5 days.  Encounter for BCP (birth control pills) initial prescription Start Ortho Tri-cyclen Lo today. Use condoms with each sexual encounter for at least two weeks. Written and verbal education provided.  Carroll Sage. Kenton Kingfisher, MSN, FNP-C [facility]  .

## 2016-07-07 ENCOUNTER — Encounter: Payer: Self-pay | Admitting: Physician Assistant

## 2016-07-07 ENCOUNTER — Ambulatory Visit (INDEPENDENT_AMBULATORY_CARE_PROVIDER_SITE_OTHER): Payer: BLUE CROSS/BLUE SHIELD | Admitting: Physician Assistant

## 2016-07-07 VITALS — BP 115/76 | HR 58 | Temp 98.1°F | Resp 18 | Ht 61.0 in | Wt 137.4 lb

## 2016-07-07 DIAGNOSIS — Z3009 Encounter for other general counseling and advice on contraception: Secondary | ICD-10-CM

## 2016-07-07 DIAGNOSIS — Z1329 Encounter for screening for other suspected endocrine disorder: Secondary | ICD-10-CM

## 2016-07-07 DIAGNOSIS — Z130 Encounter for screening for diseases of the blood and blood-forming organs and certain disorders involving the immune mechanism: Secondary | ICD-10-CM

## 2016-07-07 DIAGNOSIS — Z13228 Encounter for screening for other metabolic disorders: Secondary | ICD-10-CM

## 2016-07-07 DIAGNOSIS — Z0000 Encounter for general adult medical examination without abnormal findings: Secondary | ICD-10-CM

## 2016-07-07 DIAGNOSIS — Z124 Encounter for screening for malignant neoplasm of cervix: Secondary | ICD-10-CM

## 2016-07-07 NOTE — Progress Notes (Deleted)
   Subjective:    Patient ID: Casey Dennis, female    DOB: 11-09-1990, 26 y.o.   MRN: [id number]  HPI    Review of Systems  Psychiatric/Behavioral: Positive for decreased concentration.       Objective:   Physical Exam        Assessment & Plan:

## 2016-07-07 NOTE — Progress Notes (Signed)
PRIMARY CARE AT [facility] [address], [place] [address] [phone]  Date:  07/07/2016   Name:  Latoshia Monrroy   DOB:  [birth year]   MRN:  [id number]  PCP:  Scot Jun, FNP    History of Present Illness:  Doneisha Ivey is a 26 y.o. female patient who presents to PCP with  Chief Complaint  Patient presents with  . Annual Exam    with PAP and Nexplanon implant     DIET: SHE IS EATing a lot of beef and meat.  She tries to get in vegetables but not daily.  Water intake: >64oz.  No sodas.  Coffee once per week.    BM: Normal.  She had a bout of diarrhea recently, but this has resolved.  No blood or black stool  URINATION: No hematuria, dysuria, or frequency  SLEEP: she gets about 7 hours per night.    SOCIAL ACTIVITY: watch netflix.   Exercise: none She works at [employer].   26 years old female.   EtOH: every other weekend 5 beer drinks Tobacco or vaping: none Illicit drug use: none Sexually active: none.  One partner married.  No dyspareunia.   She feels safe in her home.   Menstrual: regular, heavy bleeding has improved.  Vaginal birth.  Once-- HPV possibility with a skin tag which went away without treatment.   Patient Active Problem List   Diagnosis Date Noted  . Normal labor 06/23/2014    Past Medical History:  Diagnosis Date  . Allergy   . Anemia   . Anxiety   . Depression   . Medical history non-contributory     Past Surgical History:  Procedure Laterality Date  . NO PAST SURGERIES      Social History  Substance Use Topics  . Smoking status: Never Smoker  . Smokeless tobacco: Never Used  . Alcohol use No    Family History  Problem Relation Age of Onset  . Cancer Father   . Diabetes Mother   . Mental illness Sister   . Mental illness Brother     No Known Allergies  Medication list has been reviewed and updated.  No current outpatient prescriptions on file prior to visit.   No current facility-administered medications on file  prior to visit.     Review of Systems  Constitutional: Negative for chills and fever.  HENT: Negative for ear discharge, ear pain and sore throat.   Eyes: Negative for blurred vision and double vision.  Respiratory: Negative for cough, shortness of breath and wheezing.   Cardiovascular: Negative for chest pain, palpitations and leg swelling.  Gastrointestinal: Negative for diarrhea, nausea and vomiting.  Genitourinary: Negative for dysuria, frequency and hematuria.  Skin: Negative for itching and rash.  Neurological: Negative for dizziness and headaches.   ROS otherwise unremarkable unless listed above.  Physical Examination: BP 115/76   Pulse (!) 58   Temp 98.1 F (36.7 C) (Oral)   Resp 18   Ht _0  (1.549 m)   Wt 137 lb 6.4 oz (62.3 kg)   LMP 06/27/2016 (Approximate)   SpO2 98%   BMI 25.96 kg/m  Ideal Body Weight: Weight in (lb) to have BMI = 25: 132  Physical Exam  Constitutional: She is oriented to person, place, and time. She appears well-developed and well-nourished. No distress.  HENT:  Head: Normocephalic and atraumatic.  Right Ear: Tympanic membrane, external ear and ear canal normal.  Left Ear: Tympanic membrane, external ear and  ear canal normal.  Nose: Right sinus exhibits no maxillary sinus tenderness and no frontal sinus tenderness. Left sinus exhibits no maxillary sinus tenderness and no frontal sinus tenderness.  Mouth/Throat: Oropharynx is clear and moist. No uvula swelling. No oropharyngeal exudate, posterior oropharyngeal edema or posterior oropharyngeal erythema.  Eyes: Conjunctivae and EOM are normal. Pupils are equal, round, and reactive to light.  Neck: Normal range of motion. Neck supple. No thyromegaly present.  Cardiovascular: Normal rate, regular rhythm, normal heart sounds and intact distal pulses.  Exam reveals no gallop, no distant heart sounds and no friction rub.   No murmur heard. Pulmonary/Chest: Effort normal and breath sounds normal. No  respiratory distress. She has no decreased breath sounds. She has no wheezes. She has no rhonchi.  Abdominal: Soft. Bowel sounds are normal. She exhibits no distension and no mass. There is no tenderness.  Musculoskeletal: Normal range of motion. She exhibits no edema or tenderness.  Lymphadenopathy:       Head (right side): No submandibular, no tonsillar, no preauricular and no posterior auricular adenopathy present.       Head (left side): No submandibular, no tonsillar, no preauricular and no posterior auricular adenopathy present.    She has no cervical adenopathy.  Neurological: She is alert and oriented to person, place, and time. No cranial nerve deficit. She exhibits normal muscle tone. Coordination normal.  Skin: Skin is warm and dry. She is not diaphoretic.  Psychiatric: She has a normal mood and affect. Her behavior is normal.  Decreased concentration     Assessment and Plan: Jakiyah Mehan is a 26 y.o. female who is here today for physical exam. She will follow up regarding implant.  Advised to contact for appt. Annual physical exam - Plan: CBC, CMP14+EGFR, TSH, Pap IG and HPV (high risk) DNA detection  Screening for iron deficiency anemia - Plan: CBC  Screening for deficiency anemia - Plan: CBC  Screening for metabolic disorder - Plan: CMP14+EGFR  Screening for thyroid disorder - Plan: TSH  Screening for cervical cancer - Plan: Pap IG and HPV (high risk) DNA detection  Encounter for counseling regarding contraception  Ivar Drape, PA-C [facility] 6/22/20189:54 PM

## 2016-07-07 NOTE — Patient Instructions (Addendum)
   IF you received an x-ray today, you will receive an invoice from [facility]. Please contact [facility] at [phone] with questions or concerns regarding your invoice.   IF you received labwork today, you will receive an invoice from [facility]. Please contact [facility] at [phone] with questions or concerns regarding your invoice.   Our billing staff will not be able to assist you with questions regarding bills from these companies.  You will be contacted with the lab results as soon as they are available. The fastest way to get your results is to activate your My Chart account. Instructions are located on the last page of this paperwork. If you have not heard from us regarding the results in 2 weeks, please contact this office.    Keeping You Healthy  Get These Tests 1. Blood Pressure- Have your blood pressure checked once a year by your health care provider.  Normal blood pressure is 120/80. 2. Weight- Have your body mass index (BMI) calculated to screen for obesity.  BMI is measure of body fat based on height and weight.  You can also calculate your own BMI at www.nhlbisupport.com/bmi/. 3. Cholesterol- Have your cholesterol checked every 5 years starting at age 20 then yearly starting at age 45. 4. Chlamydia, HIV, and other sexually transmitted diseases- Get screened every year until age 25, then within three months of each new sexual provider. 5. Pap Test - Every 1-5 years; discuss with your health care provider. 6. Mammogram- Every 1-2 years starting at age 40--50  Take these medicines  Calcium with Vitamin D-Your body needs 1200 mg of Calcium each day and 800-1000 IU of Vitamin D daily.  Your body can only absorb 500 mg of Calcium at a time so Calcium must be taken in 2 or 3 divided doses throughout the day.  Multivitamin with folic acid- Once daily if it is possible for you to become pregnant.  Get these Immunizations  Gardasil-Series of three doses;  prevents HPV related illness such as genital warts and cervical cancer.  Menactra-Single dose; prevents meningitis.  Tetanus shot- Every 10 years.  Flu shot-Every year.  Take these steps 1. Do not smoke-Your healthcare provider can help you quit.  For tips on how to quit go to www.smokefree.gov or call 1-800 QUITNOW. 2. Be physically active- Exercise 5 days a week for at least 30 minutes.  If you are not already physically active, start slow and gradually work up to 30 minutes of moderate physical activity.  Examples of moderate activity include walking briskly, dancing, swimming, bicycling, etc. 3. Breast Cancer- A self breast exam every month is important for early detection of breast cancer.  For more information and instruction on self breast exams, ask your healthcare provider or www.womenshealth.gov/faq/breast-self-exam.cfm. 4. Eat a healthy diet- Eat a variety of healthy foods such as fruits, vegetables, whole grains, low fat milk, low fat cheeses, yogurt, lean meats, poultry and fish, beans, nuts, tofu, etc.  For more information go to www. Thenutritionsource.org 5. Drink alcohol in moderation- Limit alcohol intake to one drink or less per day. Never drink and drive. 6. Depression- Your emotional health is as important as your physical health.  If you're feeling down or losing interest in things you normally enjoy please talk to your healthcare provider about being screened for depression. 7. Dental visit- Brush and floss your teeth twice daily; visit your dentist twice a year. 8. Eye doctor- Get an eye exam at least every 2 years. 9. Helmet   use- Always wear a helmet when riding a bicycle, motorcycle, rollerblading or skateboarding. 10. Safe sex- If you may be exposed to sexually transmitted infections, use a condom. 11. Seat belts- Seat belts can save your live; always wear one. 12. Smoke/Carbon Monoxide detectors- These detectors need to be installed on the appropriate level of your  home. Replace batteries at least once a year. 13. Skin cancer- When out in the sun please cover up and use sunscreen 15 SPF or higher. 14. Violence- If anyone is threatening or hurting you, please tell your healthcare provider.        

## 2016-07-08 LAB — CBC
Hematocrit: 37.1 % (ref 34.0–46.6)
Hemoglobin: 12.3 g/dL (ref 11.1–15.9)
MCH: 26.2 pg — AB (ref 26.6–33.0)
MCHC: 33.2 g/dL (ref 31.5–35.7)
MCV: 79 fL (ref 79–97)
Platelets: 260 10*3/uL (ref 150–379)
RBC: 4.7 x10E6/uL (ref 3.77–5.28)
RDW: 13.6 % (ref 12.3–15.4)
WBC: 5.9 10*3/uL (ref 3.4–10.8)

## 2016-07-08 LAB — PAP IG AND HPV HIGH-RISK
HPV, HIGH-RISK: NEGATIVE
PAP SMEAR COMMENT: 0

## 2016-07-08 LAB — CMP14+EGFR
A/G RATIO: 1.4 (ref 1.2–2.2)
ALK PHOS: 57 IU/L (ref 39–117)
ALT: 11 IU/L (ref 0–32)
AST: 14 IU/L (ref 0–40)
Albumin: 4.6 g/dL (ref 3.5–5.5)
BUN / CREAT RATIO: 17 (ref 9–23)
BUN: 13 mg/dL (ref 6–20)
Bilirubin Total: 0.2 mg/dL (ref 0.0–1.2)
CO2: 23 mmol/L (ref 20–29)
Calcium: 9.8 mg/dL (ref 8.7–10.2)
Chloride: 101 mmol/L (ref 96–106)
Creatinine, Ser: 0.75 mg/dL (ref 0.57–1.00)
GFR calc Af Amer: 126 mL/min/{1.73_m2} (ref >59)
GFR calc non Af Amer: 110 mL/min/{1.73_m2} (ref >59)
GLOBULIN, TOTAL: 3.2 g/dL (ref 1.5–4.5)
Glucose: 83 mg/dL (ref 65–99)
POTASSIUM: 4.4 mmol/L (ref 3.5–5.2)
SODIUM: 139 mmol/L (ref 134–144)
Total Protein: 7.8 g/dL (ref 6.0–8.5)

## 2016-07-08 LAB — TSH: TSH: 2.27 u[IU]/mL (ref 0.450–4.500)

## 2016-07-25 ENCOUNTER — Encounter: Payer: BLUE CROSS/BLUE SHIELD | Admitting: Physician Assistant

## 2016-07-25 ENCOUNTER — Encounter: Payer: Self-pay | Admitting: Physician Assistant

## 2016-07-25 NOTE — Patient Instructions (Signed)
     IF you received an x-ray today, you will receive an invoice from [facility]. Please contact [facility] at [phone] with questions or concerns regarding your invoice.   IF you received labwork today, you will receive an invoice from [facility]. Please contact [facility] at [phone] with questions or concerns regarding your invoice.   Our billing staff will not be able to assist you with questions regarding bills from these companies.  You will be contacted with the lab results as soon as they are available. The fastest way to get your results is to activate your My Chart account. Instructions are located on the last page of this paperwork. If you have not heard from us regarding the results in 2 weeks, please contact this office.     

## 2016-07-29 ENCOUNTER — Ambulatory Visit (INDEPENDENT_AMBULATORY_CARE_PROVIDER_SITE_OTHER): Payer: BLUE CROSS/BLUE SHIELD | Admitting: Family Medicine

## 2016-07-29 ENCOUNTER — Encounter: Payer: Self-pay | Admitting: Family Medicine

## 2016-07-29 VITALS — BP 116/77 | HR 65 | Temp 98.6°F | Resp 18 | Ht 61.18 in | Wt 135.4 lb

## 2016-07-29 DIAGNOSIS — Z30013 Encounter for initial prescription of injectable contraceptive: Secondary | ICD-10-CM | POA: Diagnosis not present

## 2016-07-29 DIAGNOSIS — Z30430 Encounter for insertion of intrauterine contraceptive device: Secondary | ICD-10-CM

## 2016-07-29 DIAGNOSIS — Z113 Encounter for screening for infections with a predominantly sexual mode of transmission: Secondary | ICD-10-CM | POA: Diagnosis not present

## 2016-07-29 LAB — POCT URINE PREGNANCY: Preg Test, Ur: NEGATIVE

## 2016-07-29 MED ORDER — MEDROXYPROGESTERONE ACETATE 150 MG/ML IM SUSY
150.0000 mg | PREFILLED_SYRINGE | Freq: Once | INTRAMUSCULAR | Status: AC
  Administered 2016-07-29: 150 mg via INTRAMUSCULAR

## 2016-07-29 NOTE — Progress Notes (Signed)
Patient was here for nexplanon, but stated at the visit that she wants iud.

## 2016-07-29 NOTE — Patient Instructions (Addendum)
IF you received an x-ray today, you will receive an invoice from [facility]. Please contact [facility] at [phone] with questions or concerns regarding your invoice.   IF you received labwork today, you will receive an invoice from [facility]. Please contact [facility] at [phone] with questions or concerns regarding your invoice.   Our billing staff will not be able to assist you with questions regarding bills from these companies.  You will be contacted with the lab results as soon as they are available. The fastest way to get your results is to activate your My Chart account. Instructions are located on the last page of this paperwork. If you have not heard from [place] regarding the results in 2 weeks, please contact this office.     Contraceptive Injection Information A contraceptive injection is a shot that prevents pregnancy. It is effective for 3 months. How does the injection work? For this injection, the medicine progestin is injected into your body. The medicine is injected under the skin or in the muscle, usually in the upper arm or the buttock. Progestin has similar effects to the hormone progesterone, which is involved with the menstrual cycle and pregnancy. The progestin injection prevents pregnancy by:  Stopping the ovaries from releasing eggs.  Thickening cervical mucus to prevent sperm from entering the cervix.  Thinning the lining of the uterus to prevent a fertilized egg from attaching to the uterus.  What are the advantages of this form of birth control? The following are some advantages of this form of birth control:  It is highly effective at preventing pregnancy when used correctly.  It can slow down the flow of heavy menstrual periods.  It can control cramps and painful menstrual periods.  It may temporarily stop your menstrual periods (amenorrhea).  It lowers your risk for developing cancer of the uterus and pelvic inflammatory  disease (PID).  What are the disadvantages of this form of birth control? The following are some disadvantages of this form of birth control:  It can be associated with side effects such as: ? Weight gain. ? Spotting or bleeding between periods. ? Breast tenderness. ? Headaches. ? Abdominal discomfort. ? Nervousness. ? Loss of bone density.  It does not protect against STIs (sexually transmitted infections).  You must visit your health care provider every 3 months (12 weeks) to receive the injection.  The injections may be uncomfortable.  It can take you up to a year to become pregnant after you stop the injections.  Am I a good candidate for these injections? Your health care provider can help you determine whether you are a good candidate for contraceptive injections. Make sure to discuss the possible side effects with your health care provider. You should not use this contraceptive if you have a history of:  Breast cancer.  High blood pressure.  Heart attack or stroke.  Liver, heart, or kidney disease.  Liver cancer.  Diabetes.  Unexplained vaginal bleeding.  Rheumatoid arthritis.  Migraines.  Summary  A contraceptive injection is a shot that prevents pregnancy. It is effective for 3 months.  This injection prevents pregnancy by thickening the cervical mucus, thinning the uterine wall, and stopping the ovaries from releasing eggs.  This type of birth control is highly effective at preventing pregnancy. It can also slow down the flow of periods or stop them temporarily.  Side effects of this injection can include weight gain, headaches, bleeding between periods, nervousness, and loss of bone density.  Your  health care provider can help you determine whether you are a good candidate for contraceptive injections. This information is not intended to replace advice given to you by your health care provider. Make sure you discuss any questions you have with your  health care provider. Document Released: 12/23/2010 Document Revised: 11/23/2015 Document Reviewed: 11/23/2015 Elsevier Interactive Patient Education  2017 [employer].

## 2016-07-29 NOTE — Progress Notes (Signed)
  Chief Complaint  Patient presents with  . Contraception    consultation on birth control options    HPI  Pt reports that she is on her period  Patient's last menstrual period was 07/26/2016. She only previous try trinessa Her friend is on Depo [id number] She reports that she has a 26 yo and one EAB She does not have active depression, no history of migraines She is a non smoker No liver disease or cancer No history of blood clots in herself Her sister had a PE but she is not sure why She reports that she had a successful pregnancy without any dvts or PE  Two years ago    Past Medical History:  Diagnosis Date  . Allergy   . Anemia   . Anxiety   . Depression   . Medical history non-contributory     No current outpatient prescriptions on file.   Current Facility-Administered Medications  Medication Dose Route Frequency Provider Last Rate Last Dose  . MedroxyPROGESTERone Acetate SUSY 150 mg  150 mg Intramuscular Once Forrest Moron, MD        Allergies: No Known Allergies  Past Surgical History:  Procedure Laterality Date  . NO PAST SURGERIES      Social History   Social History  . Marital status: Single    Spouse name: N/A  . Number of children: N/A  . Years of education: N/A   Social History Main Topics  . Smoking status: Never Smoker  . Smokeless tobacco: Never Used  . Alcohol use No  . Drug use: No  . Sexual activity: Yes    Birth control/ protection: None   Other Topics Concern  . None   Social History Narrative  . None    ROS Review of Systems See HPI Constitution: No fevers or chills No malaise No diaphoresis Skin: No rash or itching Eyes: no blurry vision, no double vision GU: no dysuria or hematuria Neuro: no dizziness or headaches   Objective: Vitals:   07/29/16 0849  BP: 116/77  Pulse: 65  Resp: 18  Temp: 98.6 F (37 C)  TempSrc: Oral  SpO2: 100%  Weight: 135 lb 6.4 oz (61.4 kg)  Height: 5' 1.18" (1.554 m)     Physical Exam  Constitutional: She is oriented to person, place, and time. She appears well-developed and well-nourished.  HENT:  Head: Normocephalic and atraumatic.  Cardiovascular: Normal rate, regular rhythm and normal heart sounds.   Pulmonary/Chest: Effort normal and breath sounds normal. No respiratory distress. She has no wheezes.  Neurological: She is alert and oriented to person, place, and time.   Urine hcg negative Urine gc/chlamydia pending  Assessment and Plan Casey Dennis was seen today for contraception.  Diagnoses and all orders for this visit:  Initiation of Depo Provera -     POCT urine pregnancy -     MedroxyPROGESTERone Acetate SUSY 150 mg; Inject 1 mL (150 mg total) into the muscle once.  Screen for STD (sexually transmitted disease) -     GC/Chlamydia Probe Amp  Visit for insertion of intrauterine device  Reviewed today that her IUD would need to be ordered Counseled on various types of contraception Depo given today Pt to return for IUD Can be scheduled on any day once her Depo is on time If her Depo is late then she needs to return on her period   [place]

## 2016-08-03 LAB — GC/CHLAMYDIA PROBE AMP
CHLAMYDIA, DNA PROBE: NEGATIVE
NEISSERIA GONORRHOEAE BY PCR: NEGATIVE

## 2016-10-19 ENCOUNTER — Ambulatory Visit (INDEPENDENT_AMBULATORY_CARE_PROVIDER_SITE_OTHER): Payer: BLUE CROSS/BLUE SHIELD | Admitting: Family Medicine

## 2016-10-19 DIAGNOSIS — Z309 Encounter for contraceptive management, unspecified: Secondary | ICD-10-CM

## 2016-10-19 MED ORDER — MEDROXYPROGESTERONE ACETATE 150 MG/ML IM SUSP
150.0000 mg | Freq: Once | INTRAMUSCULAR | Status: AC
  Administered 2016-10-19: 150 mg via INTRAMUSCULAR

## 2016-10-19 NOTE — Progress Notes (Signed)
Patient was here to receive her scheduled Depo injection.  Per patient last OV and Depo scheduling chart she is on time.  She received the injection in her RUOQ on October 19 2016 at 10:25 am by Nadara Mustard, CMA at [facility].  The injection was given with no indications of intolerance.  Patient tolerated injection well and was released after 5 minutes of observation.    Patient was given new schedule chart of when to return for her next 3 month Depo injection.  Patient verbalized understanding.

## 2017-01-05 ENCOUNTER — Ambulatory Visit (INDEPENDENT_AMBULATORY_CARE_PROVIDER_SITE_OTHER): Payer: BLUE CROSS/BLUE SHIELD | Admitting: Family Medicine

## 2017-01-05 VITALS — HR 72 | Wt 135.0 lb

## 2017-01-05 DIAGNOSIS — Z3002 Counseling and instruction in natural family planning to avoid pregnancy: Secondary | ICD-10-CM | POA: Diagnosis not present

## 2017-01-05 MED ORDER — MEDROXYPROGESTERONE ACETATE 150 MG/ML IM SUSP: 150.0000 mg | INTRAMUSCULAR | 0 refills | Status: DC

## 2017-01-05 MED ORDER — MEDROXYPROGESTERONE ACETATE 150 MG/ML IM SUSP
150.0000 mg | Freq: Once | INTRAMUSCULAR | Status: AC
  Administered 2017-01-05: 150 mg via INTRAMUSCULAR

## 2017-01-13 NOTE — Progress Notes (Signed)
Depo visit

## 2017-02-09 ENCOUNTER — Telehealth: Payer: Self-pay

## 2017-02-09 NOTE — Telephone Encounter (Signed)
Tried contacting pt via phone re: Mirena return.  Gentleman answered phone an advised pt couldn't come to the phone right now.  Advised to ask pt to call office (Tatyana Biber). The reason for call was to advise:  S  he will need to come p/u Mirena and follow instructions below to return it to the manufacturer as our office is not the owner of the devise therefore we can not return it.  Spoke with [employer] in regards to Quesada return for pt.  Advised pt didn't want Mirena and was told by [facility] our office would have to contact manufacturer for return.  Per [employer] pt will need to  show proof of purchase and write a letter documenting why she didn't want the Mirena, and add this case # [id number] to the letter also.  It will take 90 days to process the request.   [employer] contact # is [phone] and they can give the address. Dgaddy, CMA

## 2017-03-31 ENCOUNTER — Ambulatory Visit (INDEPENDENT_AMBULATORY_CARE_PROVIDER_SITE_OTHER): Payer: BLUE CROSS/BLUE SHIELD

## 2017-03-31 ENCOUNTER — Encounter: Payer: Self-pay | Admitting: Physician Assistant

## 2017-03-31 ENCOUNTER — Ambulatory Visit: Payer: BLUE CROSS/BLUE SHIELD | Admitting: Physician Assistant

## 2017-03-31 ENCOUNTER — Other Ambulatory Visit: Payer: Self-pay

## 2017-03-31 VITALS — BP 112/68 | HR 97 | Temp 99.4°F | Ht 61.81 in | Wt 130.0 lb

## 2017-03-31 DIAGNOSIS — J069 Acute upper respiratory infection, unspecified: Secondary | ICD-10-CM

## 2017-03-31 DIAGNOSIS — R0981 Nasal congestion: Secondary | ICD-10-CM | POA: Diagnosis not present

## 2017-03-31 DIAGNOSIS — R05 Cough: Secondary | ICD-10-CM | POA: Diagnosis not present

## 2017-03-31 DIAGNOSIS — R0602 Shortness of breath: Secondary | ICD-10-CM | POA: Diagnosis not present

## 2017-03-31 DIAGNOSIS — R509 Fever, unspecified: Secondary | ICD-10-CM | POA: Diagnosis not present

## 2017-03-31 DIAGNOSIS — R059 Cough, unspecified: Secondary | ICD-10-CM

## 2017-03-31 DIAGNOSIS — Z3042 Encounter for surveillance of injectable contraceptive: Secondary | ICD-10-CM

## 2017-03-31 LAB — POCT CBC
Granulocyte percent: 69.5 %G (ref 37–80)
HEMATOCRIT: 40.9 % (ref 37.7–47.9)
Hemoglobin: 12.9 g/dL (ref 12.2–16.2)
Lymph, poc: 1.7 (ref 0.6–3.4)
MCH, POC: 25.3 pg — AB (ref 27–31.2)
MCHC: 31.5 g/dL — AB (ref 31.8–35.4)
MCV: 80.3 fL (ref 80–97)
MID (cbc): 0.5 (ref 0–0.9)
MPV: 8.7 fL (ref 0–99.8)
POC Granulocyte: 5 (ref 2–6.9)
POC LYMPH %: 23.9 % (ref 10–50)
POC MID %: 6.6 % (ref 0–12)
Platelet Count, POC: 278 10*3/uL (ref 142–424)
RBC: 5.09 M/uL (ref 4.04–5.48)
RDW, POC: 13.8 %
WBC: 7.2 10*3/uL (ref 4.6–10.2)

## 2017-03-31 LAB — POC INFLUENZA A&B (BINAX/QUICKVUE)
INFLUENZA A, POC: NEGATIVE
Influenza B, POC: NEGATIVE

## 2017-03-31 MED ORDER — MUCINEX DM MAXIMUM STRENGTH 60-1200 MG PO TB12: 1.0000 | ORAL_TABLET | Freq: Two times a day (BID) | ORAL | 0 refills | Status: DC

## 2017-03-31 MED ORDER — AZELASTINE HCL 0.1 % NA SOLN: 2.0000 | Freq: Two times a day (BID) | NASAL | 0 refills | Status: DC

## 2017-03-31 MED ORDER — HYDROCODONE-HOMATROPINE 5-1.5 MG/5ML PO SYRP: 5.0000 mL | ORAL_SOLUTION | Freq: Three times a day (TID) | ORAL | 0 refills | Status: DC | PRN

## 2017-03-31 MED ORDER — MEDROXYPROGESTERONE ACETATE 150 MG/ML IM SUSY
150.0000 mg | PREFILLED_SYRINGE | Freq: Once | INTRAMUSCULAR | Status: AC
  Administered 2017-03-31: 150 mg via INTRAMUSCULAR

## 2017-03-31 MED ORDER — BENZONATATE 100 MG PO CAPS: 100.0000 mg | ORAL_CAPSULE | Freq: Three times a day (TID) | ORAL | 0 refills | Status: DC | PRN

## 2017-03-31 NOTE — Progress Notes (Signed)
MRN: [id number] DOB: August 29, 1990  Subjective:   Casey Dennis is a 27 y.o. female presenting for chief complaint of Fever (has had fever since yesterday. Having nasal congestion. Also needing birth control shot) .  Reports 2 day history of nasal congestion, sneezing, cough (mix of dry and productive), fever, chills, scratchy throat, and body aches. Cough is worse at night time, sometimes will cough so much she will feel short of breath and have chest tightness.  Has tried allergy medication with no full relief. Denies sinus pain, ear pain, wheezing, lower leg swelling, chest pain, DOE,  nausea, vomiting, abdominal pain and diarrhea. Has had sick contact with coworkers. Has history of seasonal allergies, no history of asthma. Patient has not had flu shot this season. Denies smoking.   Pt also needs depo shot today. Last depo injection was on 01/05/2017. Tolerates this medication very well. She is sexually active with monogamous partner. No concerns for STDs at this time.  Has no other questions or concerns.   Clelia has a current medication list which includes the following prescription(s): medroxyprogesterone. Also has No Known Allergies.  Glinda  has a past medical history of Allergy, Anemia, Anxiety, Depression, and Medical history non-contributory. Also  has a past surgical history that includes No past surgeries.    Social History   Socioeconomic History  . Marital status: Single    Spouse name: Not on file  . Number of children: Not on file  . Years of education: Not on file  . Highest education level: Not on file  Social Needs  . Financial resource strain: Not on file  . Food insecurity - worry: Not on file  . Food insecurity - inability: Not on file  . Transportation needs - medical: Not on file  . Transportation needs - non-medical: Not on file  Occupational History  . Not on file  Tobacco Use  . Smoking status: Never Smoker  . Smokeless tobacco: Never Used  Substance and  Sexual Activity  . Alcohol use: No  . Drug use: No  . Sexual activity: Yes    Birth control/protection: None  Other Topics Concern  . Not on file  Social History Narrative  . Not on file    Objective:   Vitals: BP 112/68 (BP Location: Left Arm, Patient Position: Sitting, Cuff Size: Normal)   Pulse 97   Temp 99.4 F (37.4 C) (Oral)   Ht 5' 1.81" (1.57 m)   Wt 130 lb (59 kg)   SpO2 97%   BMI 23.92 kg/m   Physical Exam  Constitutional: She is oriented to person, place, and time. She appears well-developed and well-nourished.  HENT:  Head: Normocephalic and atraumatic.  Right Ear: External ear and ear canal normal. Tympanic membrane is not erythematous and not bulging. A middle ear effusion is present.  Left Ear: Tympanic membrane, external ear and ear canal normal.  Nose: Mucosal edema (moderate-severe bilaterally) and rhinorrhea present. Right sinus exhibits no maxillary sinus tenderness and no frontal sinus tenderness. Left sinus exhibits no maxillary sinus tenderness and no frontal sinus tenderness.  Mouth/Throat: Uvula is midline, oropharynx is clear and moist and mucous membranes are normal. No tonsillar exudate.  Eyes: Conjunctivae are normal.  Neck: Normal range of motion.  Cardiovascular: Normal rate, regular rhythm, normal heart sounds and intact distal pulses.  Pulmonary/Chest: Effort normal. She has no decreased breath sounds. She has no wheezes. She has no rhonchi ( ). She has no rales.  Musculoskeletal:  Right lower leg: She exhibits no swelling.       Left lower leg: She exhibits no swelling.  Lymphadenopathy:       Head (right side): No submental, no submandibular, no tonsillar, no preauricular, no posterior auricular and no occipital adenopathy present.       Head (left side): No submental, no submandibular, no tonsillar, no preauricular, no posterior auricular and no occipital adenopathy present.    She has no cervical adenopathy.       Right: No  supraclavicular adenopathy present.       Left: No supraclavicular adenopathy present.  Neurological: She is alert and oriented to person, place, and time.  Skin: Skin is warm and dry.  Psychiatric: She has a normal mood and affect.  Vitals reviewed.   Results for orders placed or performed in visit on 03/31/17 (from the past 24 hour(s))  POC Influenza A&B(BINAX/QUICKVUE)     Status: None   Collection Time: 03/31/17  3:24 PM  Result Value Ref Range   Influenza A, POC Negative Negative   Influenza B, POC Negative Negative  POCT CBC     Status: Abnormal   Collection Time: 03/31/17  3:49 PM  Result Value Ref Range   WBC 7.2 4.6 - 10.2 K/uL   Lymph, poc 1.7 0.6 - 3.4   POC LYMPH PERCENT 23.9 10 - 50 %L   MID (cbc) 0.5 0 - 0.9   POC MID % 6.6 0 - 12 %M   POC Granulocyte 5.0 2 - 6.9   Granulocyte percent 69.5 37 - 80 %G   RBC 5.09 4.04 - 5.48 M/uL   Hemoglobin 12.9 12.2 - 16.2 g/dL   HCT, POC 40.9 37.7 - 47.9 %   MCV 80.3 80 - 97 fL   MCH, POC 25.3 (A) 27 - 31.2 pg   MCHC 31.5 (A) 31.8 - 35.4 g/dL   RDW, POC 13.8 %   Platelet Count, POC 278 142 - 424 K/uL   MPV 8.7 0 - 99.8 fL   Dg Chest 2 View  Result Date: 03/31/2017 CLINICAL DATA:  27 y/o F; 2 days of cough, fever, and shortness of breath. EXAM: CHEST - 2 VIEW COMPARISON:  None. FINDINGS: The heart size and mediastinal contours are within normal limits. Both lungs are clear. The visualized skeletal structures are unremarkable. IMPRESSION: No acute pulmonary process identified. Electronically Signed   By: Kristine Garbe M.D.   On: 03/31/2017 15:55     Assessment and Plan :  1. Fever, unspecified - POC Influenza A&B(BINAX/QUICKVUE) - POCT CBC - DG Chest 2 View; Future 2. Cough 3. Shortness of breath - benzonatate (TESSALON) 100 MG capsule; Take 1-2 capsules (100-200 mg total) by mouth 3 (three) times daily as needed for cough.  Dispense: 40 capsule; Refill: 0 - Dextromethorphan-Guaifenesin (MUCINEX DM MAXIMUM  STRENGTH) 60-1200 MG TB12; Take 1 tablet by mouth every 12 (twelve) hours.  Dispense: 20 each; Refill: 0 - HYDROcodone-homatropine (HYCODAN) 5-1.5 MG/5ML syrup; Take 5 mLs by mouth every 8 (eight) hours as needed for cough.  Dispense: 120 mL; Refill: 0 4. Nasal congestion - azelastine (ASTELIN) 0.1 % nasal spray; Place 2 sprays into both nostrils 2 (two) times daily. Use in each nostril as directed  Dispense: 30 mL; Refill: 0 5. Acute upper respiratory infection Hx, PE findings, and lab/xray most consistent with URI. POC flu test negative. WBC wnl. CXR normal. She is overall well appearing, no distress. Vitals stable. Likely viral etiology. Recommend symptomatic tx at  this time. Advised to return to clinic if symptoms worsen, do not improve, or as needed.   6. Encounter for management and injection of depo-Provera - medroxyPROGESTERone Acetate SUSY 150 mg   Tenna Delaine, PA-C  [facility] 03/31/2017 3:59 PM

## 2017-03-31 NOTE — Patient Instructions (Addendum)
- We will treat this as a respiratory viral infection.  - I recommend you rest, drink plenty of fluids, eat light meals including soups.  - You may use cough syrup at night for your cough and sore throat, Tessalon pearls during the day if you want to stop the cough and mucinex if you want to help bring stuff up. Be aware that cough syrup can definitely make you drowsy and sleepy so do not drive or operate any heavy machinery if it is affecting you during the day.  - You may also use nasal spray for nasal congestion and Tylenol or ibuprofen over-the-counter for your sore throat.  - Please let me know if you are not seeing any improvement or get worse in 5-7 days.   Return for next depo between May 31 - Jun 14.  Upper Respiratory Infection, Adult Most upper respiratory infections (URIs) are caused by a virus. A URI affects the nose, throat, and upper air passages. The most common type of URI is often called "the common cold." Follow these instructions at home:  Take medicines only as told by your doctor.  Gargle warm saltwater or take cough drops to comfort your throat as told by your doctor.  Use a warm mist humidifier or inhale steam from a shower to increase air moisture. This may make it easier to breathe.  Drink enough fluid to keep your pee (urine) clear or pale yellow.  Eat soups and other clear broths.  Have a healthy diet.  Rest as needed.  Go back to work when your fever is gone or your doctor says it is okay. ? You may need to stay home longer to avoid giving your URI to others. ? You can also wear a face mask and wash your hands often to prevent spread of the virus.  Use your inhaler more if you have asthma.  Do not use any tobacco products, including cigarettes, chewing tobacco, or electronic cigarettes. If you need help quitting, ask your doctor. Contact a doctor if:  You are getting worse, not better.  Your symptoms are not helped by medicine.  You have  chills.  You are getting more short of breath.  You have brown or red mucus.  You have yellow or brown discharge from your nose.  You have pain in your face, especially when you bend forward.  You have a fever.  You have puffy (swollen) neck glands.  You have pain while swallowing.  You have white areas in the back of your throat. Get help right away if:  You have very bad or constant: ? Headache. ? Ear pain. ? Pain in your forehead, behind your eyes, and over your cheekbones (sinus pain). ? Chest pain.  You have long-lasting (chronic) lung disease and any of the following: ? Wheezing. ? Long-lasting cough. ? Coughing up blood. ? A change in your usual mucus.  You have a stiff neck.  You have changes in your: ? Vision. ? Hearing. ? Thinking. ? Mood. This information is not intended to replace advice given to you by your health care provider. Make sure you discuss any questions you have with your health care provider. Document Released: 06/22/2007 Document Revised: 09/06/2015 Document Reviewed: 04/10/2013 Elsevier Interactive Patient Education  2018 [employer].      IF you received an x-ray today, you will receive an invoice from [facility]. Please contact [facility] at [phone] with questions or concerns regarding your invoice.   IF you received labwork  today, you will receive an invoice from [facility]. Please contact [facility] at [phone] with questions or concerns regarding your invoice.   Our billing staff will not be able to assist you with questions regarding bills from these companies.  You will be contacted with the lab results as soon as they are available. The fastest way to get your results is to activate your My Chart account. Instructions are located on the last page of this paperwork. If you have not heard from [place] regarding the results in 2 weeks, please contact this office.

## 2017-04-19 ENCOUNTER — Encounter: Payer: Self-pay | Admitting: Physician Assistant

## 2017-07-18 DIAGNOSIS — F314 Bipolar disorder, current episode depressed, severe, without psychotic features: Secondary | ICD-10-CM | POA: Diagnosis not present

## 2017-07-25 DIAGNOSIS — R42 Dizziness and giddiness: Secondary | ICD-10-CM | POA: Diagnosis not present

## 2017-08-02 DIAGNOSIS — F314 Bipolar disorder, current episode depressed, severe, without psychotic features: Secondary | ICD-10-CM | POA: Diagnosis not present

## 2017-08-24 ENCOUNTER — Encounter (HOSPITAL_COMMUNITY): Payer: Self-pay | Admitting: *Deleted

## 2017-08-24 ENCOUNTER — Emergency Department (HOSPITAL_COMMUNITY)
Admission: EM | Admit: 2017-08-24 | Discharge: 2017-08-25 | Disposition: A | Discharge status: Other Acute Inpatient Hospital | Payer: BLUE CROSS/BLUE SHIELD | Attending: Emergency Medicine | Admitting: Emergency Medicine

## 2017-08-24 DIAGNOSIS — R45851 Suicidal ideations: Secondary | ICD-10-CM

## 2017-08-24 DIAGNOSIS — Z046 Encounter for general psychiatric examination, requested by authority: Secondary | ICD-10-CM | POA: Diagnosis not present

## 2017-08-24 DIAGNOSIS — F329 Major depressive disorder, single episode, unspecified: Secondary | ICD-10-CM | POA: Insufficient documentation

## 2017-08-24 DIAGNOSIS — R440 Auditory hallucinations: Secondary | ICD-10-CM | POA: Diagnosis not present

## 2017-08-24 DIAGNOSIS — F419 Anxiety disorder, unspecified: Secondary | ICD-10-CM | POA: Diagnosis not present

## 2017-08-24 DIAGNOSIS — Z79899 Other long term (current) drug therapy: Secondary | ICD-10-CM | POA: Diagnosis not present

## 2017-08-24 LAB — COMPREHENSIVE METABOLIC PANEL
ALK PHOS: 51 U/L (ref 38–126)
ALT: 17 U/L (ref 0–44)
AST: 17 U/L (ref 15–41)
Albumin: 4.5 g/dL (ref 3.5–5.0)
Anion gap: 10 (ref 5–15)
BILIRUBIN TOTAL: 0.8 mg/dL (ref 0.3–1.2)
BUN: 10 mg/dL (ref 6–20)
CALCIUM: 9.8 mg/dL (ref 8.9–10.3)
CO2: 24 mmol/L (ref 22–32)
CREATININE: 0.83 mg/dL (ref 0.44–1.00)
Chloride: 107 mmol/L (ref 98–111)
GFR calc Af Amer: >60 mL/min (ref >60)
Glucose, Bld: 101 mg/dL — ABNORMAL HIGH (ref 70–99)
Potassium: 4.2 mmol/L (ref 3.5–5.1)
Sodium: 141 mmol/L (ref 135–145)
Total Protein: 8.3 g/dL — ABNORMAL HIGH (ref 6.5–8.1)

## 2017-08-24 LAB — SALICYLATE LEVEL: Salicylate Lvl: <7 mg/dL (ref 2.8–30.0)

## 2017-08-24 LAB — CBC WITH DIFFERENTIAL/PLATELET
ABS IMMATURE GRANULOCYTES: 0 10*3/uL (ref 0.0–0.1)
BASOS ABS: 0 10*3/uL (ref 0.0–0.1)
Basophils Relative: 0 %
EOS PCT: 0 %
Eosinophils Absolute: 0 10*3/uL (ref 0.0–0.7)
HCT: 38.8 % (ref 36.0–46.0)
Hemoglobin: 12.2 g/dL (ref 12.0–15.0)
IMMATURE GRANULOCYTES: 0 %
Lymphocytes Relative: 12 %
Lymphs Abs: 1.3 10*3/uL (ref 0.7–4.0)
MCH: 25.7 pg — ABNORMAL LOW (ref 26.0–34.0)
MCHC: 31.4 g/dL (ref 30.0–36.0)
MCV: 81.9 fL (ref 78.0–100.0)
Monocytes Absolute: 0.3 10*3/uL (ref 0.1–1.0)
Monocytes Relative: 3 %
NEUTROS ABS: 9.2 10*3/uL — AB (ref 1.7–7.7)
NEUTROS PCT: 85 %
Platelets: 271 10*3/uL (ref 150–400)
RBC: 4.74 MIL/uL (ref 3.87–5.11)
RDW: 12.9 % (ref 11.5–15.5)
WBC: 10.9 10*3/uL — AB (ref 4.0–10.5)

## 2017-08-24 LAB — RAPID URINE DRUG SCREEN, HOSP PERFORMED
Amphetamines: NOT DETECTED
Barbiturates: NOT DETECTED
Benzodiazepines: NOT DETECTED
Cocaine: NOT DETECTED
Opiates: NOT DETECTED
Tetrahydrocannabinol: NOT DETECTED

## 2017-08-24 LAB — ACETAMINOPHEN LEVEL

## 2017-08-24 LAB — ETHANOL

## 2017-08-24 LAB — I-STAT BETA HCG BLOOD, ED (MC, WL, AP ONLY)

## 2017-08-24 MED ORDER — PROPRANOLOL HCL 40 MG PO TABS
20.0000 mg | ORAL_TABLET | Freq: Once | ORAL | Status: DC
  Filled 2017-08-24: qty 1

## 2017-08-24 MED ORDER — LORAZEPAM 1 MG PO TABS
1.0000 mg | ORAL_TABLET | ORAL | Status: DC | PRN
  Administered 2017-08-24: 1 mg via ORAL
  Filled 2017-08-24 (×2): qty 1

## 2017-08-24 NOTE — ED Provider Notes (Signed)
Patient placed in Quick Look pathway, seen and evaluated   Chief Complaint: Possible medication reaction  HPI:   Placed on Risperdal 3 weeks ago.  Has begun to have racing, guilty thoughts, voices telling her to kill herself without specific instructions, dealing tingly all over, and feeling panicked.  States she has not felt like this before.  She denies previous suicide attempts.  Occasional marijuana use.  Denies alcohol use.  Denies active SI or plan.  ROS: Voices (one)  Physical Exam:   Gen: No distress  Neuro: Awake and Alert  Skin: Warm    Focused Exam:   No diaphoresis.  No pallor.  Appears anxious.  Initiation of care has begun. The patient has been counseled on the process, plan, and necessity for staying for the completion/evaluation, and the remainder of the medical screening examination   Casey Dennis 08/24/17 1533    Drenda Freeze, MD 08/25/17 [phone]
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
## 2017-08-24 NOTE — ED Triage Notes (Signed)
Pt in stating she is being treated for bipolar disorder at [facility], they have made several changes to her medications recently, she started Risperdal three weeks ago and feels like she is having a reaction to the medication, today she experiencing guilty thoughts, racing thoughts, she is having panic attacks, today she has had suicidal thoughts, but knows she does not want to kill herself but she cannot control the thoughts that are entering her mind. Pt wants to discontinue medication and was sent here for evaluation to determine next steps. Denies SI at this time.

## 2017-08-24 NOTE — ED Provider Notes (Signed)
[facility]GENCY DEPARTMENT Provider Note   CSN: [id number] Arrival date & time: 08/24/17  1505     History   Chief Complaint Chief Complaint  Patient presents with  . Medication Reaction    HPI Casey Dennis is a 27 y.o. female hx of anxiety, depression, bipolar here with suicidal ideation. Patient states that she was started on risperdal 3 week ago. Patient states that she has some racing thought for the last week or so. She also felt that "her conscience was bothering her" and she is feeling guilty. She heard voices earlier in the day that told her to kill herself but she has no particular plan. She also felt that she had a panic attack as well. Denies alcohol use. She has occasional marijuana use.   The history is provided by the patient.    Past Medical History:  Diagnosis Date  . Allergy   . Anemia   . Anxiety   . Depression   . Medical history non-contributory     Patient Active Problem List   Diagnosis Date Noted  . Normal labor 06/23/2014    Past Surgical History:  Procedure Laterality Date  . NO PAST SURGERIES       OB History    Gravida  2   Para  1   Term  1   Preterm      AB  1   Living  1     SAB      TAB  1   Ectopic      Multiple  0   Live Births  1            Home Medications    Prior to Admission medications   Medication Sig Start Date End Date Taking? Authorizing Provider  azelastine (ASTELIN) 0.1 % nasal spray Place 2 sprays into both nostrils 2 (two) times daily. Use in each nostril as directed 03/31/17   Tenna Delaine D, PA-C  benzonatate (TESSALON) 100 MG capsule Take 1-2 capsules (100-200 mg total) by mouth 3 (three) times daily as needed for cough. 03/31/17   Tenna Delaine D, PA-C  Dextromethorphan-Guaifenesin (MUCINEX DM MAXIMUM STRENGTH) 60-1200 MG TB12 Take 1 tablet by mouth every 12 (twelve) hours. 03/31/17   Tenna Delaine D, PA-C  HYDROcodone-homatropine (HYCODAN) 5-1.5 MG/5ML  syrup Take 5 mLs by mouth every 8 (eight) hours as needed for cough. 03/31/17   Leonie Douglas, PA-C    Family History Family History  Problem Relation Age of Onset  . Cancer Father   . Diabetes Mother   . Mental illness Sister   . Mental illness Brother     Social History Social History   Tobacco Use  . Smoking status: Never Smoker  . Smokeless tobacco: Never Used  Substance Use Topics  . Alcohol use: No  . Drug use: No     Allergies   Patient has no known allergies.   Review of Systems Review of Systems  Psychiatric/Behavioral: Positive for dysphoric mood. The patient is nervous/anxious.   All other systems reviewed and are negative.    Physical Exam Updated Vital Signs BP 119/75   Pulse 61   Temp 98.4 F (36.9 C) (Oral)   Resp 14   SpO2 100%   Physical Exam  Constitutional: She is oriented to person, place, and time.  Slightly anxious   HENT:  Head: Normocephalic.  Mouth/Throat: Oropharynx is clear and moist.  Eyes: Pupils are equal, round, and reactive to light.  Conjunctivae and EOM are normal.  Neck: Normal range of motion. Neck supple.  Cardiovascular: Normal rate and normal heart sounds.  Pulmonary/Chest: Effort normal and breath sounds normal.  Abdominal: Soft. Bowel sounds are normal. She exhibits no distension. There is no tenderness.  Musculoskeletal: Normal range of motion.  Neurological: She is alert and oriented to person, place, and time.  Skin: Skin is warm.  Nursing note and vitals reviewed.    ED Treatments / Results  Labs (all labs ordered are listed, but only abnormal results are displayed) Labs Reviewed  COMPREHENSIVE METABOLIC PANEL - Abnormal; Notable for the following components:      Result Value   Glucose, Bld 101 (*)    Total Protein 8.3 (*)    All other components within normal limits  CBC WITH DIFFERENTIAL/PLATELET - Abnormal; Notable for the following components:   WBC 10.9 (*)    MCH 25.7 (*)    Neutro Abs  9.2 (*)    All other components within normal limits  ACETAMINOPHEN LEVEL - Abnormal; Notable for the following components:   Acetaminophen (Tylenol), Serum <10 (*)    All other components within normal limits  ETHANOL  RAPID URINE DRUG SCREEN, HOSP PERFORMED  SALICYLATE LEVEL  I-STAT BETA HCG BLOOD, ED ([facility], [facility], [facility] ONLY)    EKG None  Radiology No results found.  Procedures Procedures (including critical care time)  Medications Ordered in ED Medications  propranolol (INDERAL) tablet 20 mg (has no administration in time range)     Initial Impression / Assessment and Plan / ED Course  I have reviewed the triage vital signs and the nursing notes.  Pertinent labs & imaging results that were available during my care of the patient were reviewed by me and considered in my medical decision making (see chart for details).     Casey Dennis is a 27 y.o. female here with suicidal ideation. She thinks she has side effect from risperdal. But she also has some guilty thoughts, palpitations, racing thoughts. I wonder if she is hypomanic. Will get psych clearance labs, consult TTS.   9:40 PM Labs unremarkable. Medically cleared for psych eval. Not suicidal currently.    Final Clinical Impressions(s) / ED Diagnoses   Final diagnoses:  None    ED Discharge Orders    None       Drenda Freeze, MD 08/24/17 2141

## 2017-08-25 ENCOUNTER — Other Ambulatory Visit: Payer: Self-pay

## 2017-08-25 ENCOUNTER — Encounter (HOSPITAL_COMMUNITY): Payer: Self-pay | Admitting: *Deleted

## 2017-08-25 ENCOUNTER — Inpatient Hospital Stay (HOSPITAL_COMMUNITY)
Admission: AD | Admit: 2017-08-25 | Discharge: 2017-08-28 | DRG: 885 | Disposition: A | Discharge status: Home / Self Care | Payer: BLUE CROSS/BLUE SHIELD | Source: Intra-hospital | Attending: Psychiatry | Admitting: Psychiatry

## 2017-08-25 DIAGNOSIS — G4700 Insomnia, unspecified: Secondary | ICD-10-CM | POA: Diagnosis present

## 2017-08-25 DIAGNOSIS — R45851 Suicidal ideations: Secondary | ICD-10-CM | POA: Diagnosis not present

## 2017-08-25 DIAGNOSIS — F319 Bipolar disorder, unspecified: Secondary | ICD-10-CM | POA: Diagnosis not present

## 2017-08-25 DIAGNOSIS — F410 Panic disorder [episodic paroxysmal anxiety] without agoraphobia: Secondary | ICD-10-CM | POA: Diagnosis present

## 2017-08-25 DIAGNOSIS — Z818 Family history of other mental and behavioral disorders: Secondary | ICD-10-CM | POA: Diagnosis not present

## 2017-08-25 MED ORDER — PROPRANOLOL HCL 10 MG PO TABS
10.0000 mg | ORAL_TABLET | Freq: Two times a day (BID) | ORAL | Status: DC
  Administered 2017-08-25 – 2017-08-28 (×3): 10 mg via ORAL
  Filled 2017-08-25 (×11): qty 1

## 2017-08-25 MED ORDER — ACETAMINOPHEN 325 MG PO TABS
650.0000 mg | ORAL_TABLET | Freq: Four times a day (QID) | ORAL | Status: DC | PRN
  Administered 2017-08-26 – 2017-08-27 (×2): 650 mg via ORAL
  Filled 2017-08-25 (×2): qty 2

## 2017-08-25 MED ORDER — ALUM & MAG HYDROXIDE-SIMETH 200-200-20 MG/5ML PO SUSP: 30.0000 mL | ORAL | Status: DC | PRN

## 2017-08-25 MED ORDER — DIPHENHYDRAMINE HCL 25 MG PO CAPS: 25.0000 mg | ORAL_CAPSULE | Freq: Four times a day (QID) | ORAL | Status: DC | PRN

## 2017-08-25 MED ORDER — LORAZEPAM 1 MG PO TABS: 1.0000 mg | ORAL_TABLET | Freq: Four times a day (QID) | ORAL | Status: DC | PRN

## 2017-08-25 MED ORDER — DIPHENHYDRAMINE HCL 50 MG/ML IJ SOLN: 25.0000 mg | Freq: Four times a day (QID) | INTRAMUSCULAR | Status: DC | PRN

## 2017-08-25 MED ORDER — TRAZODONE HCL 50 MG PO TABS
50.0000 mg | ORAL_TABLET | Freq: Every evening | ORAL | Status: DC | PRN
  Administered 2017-08-27: 50 mg via ORAL
  Filled 2017-08-25 (×3): qty 1

## 2017-08-25 MED ORDER — MAGNESIUM HYDROXIDE 400 MG/5ML PO SUSP: 30.0000 mL | Freq: Every day | ORAL | Status: DC | PRN

## 2017-08-25 MED ORDER — LORAZEPAM 0.5 MG PO TABS: 0.5000 mg | ORAL_TABLET | Freq: Four times a day (QID) | ORAL | Status: DC | PRN

## 2017-08-25 MED ORDER — LORAZEPAM 1 MG PO TABS
1.0000 mg | ORAL_TABLET | Freq: Three times a day (TID) | ORAL | Status: DC | PRN
  Administered 2017-08-26: 1 mg via ORAL
  Filled 2017-08-25: qty 1

## 2017-08-25 MED ORDER — HALOPERIDOL 5 MG PO TABS: 5.0000 mg | ORAL_TABLET | Freq: Four times a day (QID) | ORAL | Status: DC | PRN

## 2017-08-25 MED ORDER — HYDROXYZINE HCL 25 MG PO TABS: 25.0000 mg | ORAL_TABLET | Freq: Three times a day (TID) | ORAL | Status: DC | PRN

## 2017-08-25 MED ORDER — LORAZEPAM 2 MG/ML IJ SOLN: 1.0000 mg | Freq: Four times a day (QID) | INTRAMUSCULAR | Status: DC | PRN

## 2017-08-25 MED ORDER — HALOPERIDOL LACTATE 5 MG/ML IJ SOLN: 5.0000 mg | Freq: Four times a day (QID) | INTRAMUSCULAR | Status: DC | PRN

## 2017-08-25 NOTE — Progress Notes (Signed)
Pt accepted to [facility], Bed 402-1  Marvia Pickles, NP is the accepting provider.  Dr. Neita Garnet is the attending provider.  Call report to [phone]  @ [facility] Psych ED notified.   Pt is Voluntary.  Pt may be transported by [facility]  Pt scheduled  to arrive at [facility]@12 :00  [facility] T. Romie Minus, MSW, [facility] Disposition Clinical Social Work [phone] (cell) [phone] (office)

## 2017-08-25 NOTE — Tx Team (Deleted)
Initial Treatment Plan 08/25/2017 7:13 PM [name] PJS:[id number]    PATIENT STRESSORS: Educational concerns Financial difficulties   PATIENT STRENGTHS: Ability for insight Active sense of humor   PATIENT IDENTIFIED PROBLEMS:                      DISCHARGE CRITERIA:  Ability to meet basic life and health needs Adequate post-discharge living arrangements Improved stabilization in mood, thinking, and/or behavior  PRELIMINARY DISCHARGE PLAN: Attend aftercare/continuing care group Attend PHP/IOP  PATIENT/FAMILY INVOLVEMENT: This treatment plan has been presented to and reviewed with the patient, Casey Dennis, and/or family member, .  The patient and family have been given the opportunity to ask questions and make suggestions.  Lauralyn Primes, RN 08/25/2017, 7:13 PM
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
## 2017-08-25 NOTE — ED Notes (Signed)
Rec'd call for acceptance to [facility]; attemtpted to call report to [facility]

## 2017-08-25 NOTE — ED Notes (Signed)
Pt wanded by security, all belongings sent home with husband

## 2017-08-25 NOTE — BHH Suicide Risk Assessment (Signed)
[facility] Admission Suicide Risk Assessment   Nursing information obtained from:   patient and chart Demographic factors:   57, married, has a three year old child  Current Mental Status:   see below Loss Factors:   father medically ill, recent medication trials have not been well tolerated  Historical Factors:   depression, anxiety, prior diagnosis of Bipolar Disorder  Risk Reduction Factors:   resilience  Total Time spent with patient: 45 minutes Principal Problem: suicidal ideations, panic attack Diagnosis:   Patient Active Problem List   Diagnosis Date Noted  . Bipolar disorder, unspecified ([place]) [F31.9] 08/25/2017  . Normal labor [O80, Z37.9] 06/23/2014   Subjective Data:   Continued Clinical Symptoms:    The "Alcohol Use Disorders Identification Test", Guidelines for Use in Primary Care, Second Edition.  World Pharmacologist [employer]). Score between 0-7:  no or low risk or alcohol related problems. Score between 8-15:  moderate risk of alcohol related problems. Score between 16-19:  high risk of alcohol related problems. Score 20 or above:  warrants further diagnostic evaluation for alcohol dependence and treatment.   CLINICAL FACTORS:  27 year old married female, presented to ED reporting racing thoughts, panic attacks and acute onset suicidal ideations. Describes discrete episode of intense anxiety , subjective shortness of breath, racing thoughts, and suicidal ideations yesterday, of about one hour duration, and describes increased anxiety and panic symptoms over recent days . States she was diagnosed with Bipolar Disorder and that she was started on Risperidone ( currently on 0.25 mgr daily) 2-3 weeks ago. States she has not tolerated it well and feels it has contributed to increased anxiety, panic symptoms     Psychiatric Specialty Exam: Physical Exam  ROS  unknown if currently breastfeeding.There is no height or weight on file to calculate BMI.  See admit note  MSE   COGNITIVE FEATURES THAT CONTRIBUTE TO RISK:  Closed-mindedness and Loss of executive function    SUICIDE RISK:   Moderate:  Frequent suicidal ideation with limited intensity, and duration, some specificity in terms of plans, no associated intent, good self-control, limited dysphoria/symptomatology, some risk factors present, and identifiable protective factors, including available and accessible social support.  PLAN OF CARE: Patient will be admitted to inpatient psychiatric unit for stabilization and safety. Will provide and encourage milieu participation. Provide medication management and maked adjustments as needed.  Will follow daily.    I certify that inpatient services furnished can reasonably be expected to improve the patient's condition.   Jenne Campus, MD 08/25/2017, 6:30 PM

## 2017-08-25 NOTE — BHH Group Notes (Signed)
  [facility] LCSW Group Therapy Note  Date/Time: 08/25/17, 1315  Type of Therapy/Topic:  Group Therapy:  Emotion Regulation  Participation Level:  Active   Mood:pleaseant  Description of Group:    The purpose of this group is to assist patients in learning to regulate negative emotions and experience positive emotions. Patients will be guided to discuss ways in which they have been vulnerable to their negative emotions. These vulnerabilities will be juxtaposed with experiences of positive emotions or situations, and patients challenged to use positive emotions to combat negative ones. Special emphasis will be placed on coping with negative emotions in conflict situations, and patients will process healthy conflict resolution skills.  Therapeutic Goals: 1. Patient will identify two positive emotions or experiences to reflect on in order to balance out negative emotions:  2. Patient will label two or more emotions that they find the most difficult to experience:  3. Patient will be able to demonstrate positive conflict resolution skills through discussion or role plays:   Summary of Patient Progress:Pt shared that sadness is an emotion that is difficult for her to experience.  Pt active in group discussion regarding positive ways to deal with difficult emotions.       Therapeutic Modalities:   Cognitive Behavioral Therapy Feelings Identification Dialectical Behavioral Therapy  Lurline Idol, LCSW

## 2017-08-25 NOTE — Progress Notes (Signed)
Patient is 27 yo female who is admitted for the first time to [facility] from [facility] ED. She is tearful, depressed, anxious and shares with writer she has Bipolar disorder, and she was placed on rresperidal approx. 3 weeks ago, she began haviing hallucinations and she feeels she has deteriorated . She says her thinking is " very" disorgainzed, that she is having difficulty completing thought processes and that she is scared, uncomfortable and she says " I want to get on the right medicine". She denies drug abuse, admits she smokes pot daily ( for mood control) and says " I really need to get something becasuee I have a 45 yo daughter at home that I need to be taking of". She denies PMH, reports she takes no other medications - other than the resperidal - and says " I'm not going to hurt my self ...ibuprofen qam o like that". Pt admission is completed and pt oriented to the unit.

## 2017-08-25 NOTE — Tx Team (Addendum)
Initial Treatment Plan 08/25/2017 7:07 PM Casey Dennis PRA:[id number]    PATIENT STRESSORS: Educational concerns Financial difficulties Health problems   PATIENT STRENGTHS: Ability for insight Active sense of humor Average or above average intelligence   PATIENT IDENTIFIED PROBLEMS: Depression  Bipolar  Suicidal Ideation          " My racing thoughts are hard to deal with"  " I'm tired of feeling depressed"     DISCHARGE CRITERIA:  Ability to meet basic life and health needs Adequate post-discharge living arrangements Improved stabilization in mood, thinking, and/or behavior  PRELIMINARY DISCHARGE PLAN: Attend aftercare/continuing care group Attend PHP/IOP  PATIENT/FAMILY INVOLVEMENT: This treatment plan has been presented to and reviewed with the patient, Casey Dennis, and/or family member,   The patient and family have been given the opportunity to ask questions and make suggestions.  Lauralyn Primes, RN 08/25/2017, 7:07 PM

## 2017-08-26 LAB — TSH: TSH: 3.407 u[IU]/mL (ref 0.350–4.500)

## 2017-08-26 NOTE — BHH Group Notes (Signed)
[facility] Group Notes:  (Nursing/MHT/Case Management/Adjunct)  Date:  08/26/2017  Time:  1:54 PM  Type of Therapy:  Psychoeducational Skills  Participation Level:  Active  Participation Quality:  Appropriate  Affect:  Appropriate  Cognitive:  Appropriate  Insight:  Appropriate  Engagement in Group:  Engaged  Modes of Intervention:  Problem-solving  Summary of Progress/Problems: Pt attended Psychoeducational group with top topic anger management.   Casey Dennis Shanta 08/26/2017, 1:54 PM

## 2017-08-26 NOTE — Progress Notes (Signed)
D Patient is observed OOB UAL on the 400 hall today. She tolerates this fairly well. She is less isolative, shy and less depressed. This is evidenced by her affect, she is less anxious and scared-looking. She is more relaxed, as she is seen sitting in the 400 hall dayroom amongst her peers.     A She completed her daily assessment and on this she wrote she denied SI today and she rated her depression, hopelessness and anxiety " 0/0/2", respectively. She is focused today on getting well enough to be discharged and to retunr hoem to her daughter and ehr husband. Pt is encouraged to stay in the moment and focus on daily goals.     R Safety is in place.

## 2017-08-26 NOTE — Plan of Care (Signed)
  Problem: Education: Goal: Knowledge of [facility] Education information/materials will improve Outcome: Progressing Goal: Emotional status will improve Outcome: Progressing   
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
## 2017-08-26 NOTE — Plan of Care (Signed)
  Problem: Education: Goal: Emotional status will improve Outcome: Progressing   

## 2017-08-26 NOTE — BHH Group Notes (Signed)
LCSW Group Therapy Note  08/26/2017    10:30-11:30am   Type of Therapy and Topic:  Group Therapy: Anger and Coping Skills  Participation Level:  Active   Description of Group:   In this group, patients learned how to recognize the physical, cognitive, emotional, and behavioral responses they have to anger-provoking situations.  They identified how they usually or often react when angered, and learned how healthy and unhealthy coping skills work initially, but the unhealthy ones stop working.   They analyzed how their frequently-chosen coping skill is possibly beneficial and how it is possibly unhelpful.  The group discussed a variety of healthier coping skills that could help in resolving the actual issues, as well as how to go about planning for the the possibility of future similar situations.  Therapeutic Goals: 1. Patients will identify one thing that makes them angry and how they feel emotionally and physically, what their thoughts are or tend to be in those situations, and what healthy or unhealthy coping mechanism they typically use 2. Patients will identify how their coping technique works for them, as well as how it works against them. 3. Patients will explore possible new behaviors to use in future anger situations. 4. Patients will learn that anger itself is normal and cannot be eliminated, and that healthier coping skills can assist with resolving conflict rather than worsening situations.  Summary of Patient Progress:  The patient shared that she was "mad" a few days ago when it took a long time to get seen in the Emergency Room, and she almost walked out but did not because she knew that she needed help.  Therapeutic Modalities:   Cognitive Behavioral Therapy Motivation Interviewing  Maretta Los  .

## 2017-08-26 NOTE — Progress Notes (Signed)
Pt is new to the unit this afternoon.  She reports that she really needs to be discharged as she has a good friend who is in town that will be leaving on Monday, and she has not had opportunity to spend time with her friend.  Pt is denying SI/HI/AVH at this time.  She requested to sign a 72h request for discharge which she did at 2100.  Writer explained the process of the 72h request prior to pt signing it.  Pt has been appropriate and cooperative with staff.  She was observed sitting in the dayroom all evening working on a puzzle with other patients.  She states she has no trouble sleeping and did not need a sleep aid with her scheduled propranolol.  Support and encouragement offered.  Discharge plans are in process.  Pt was encouraged to discuss her discharge concerns with the doctor in the morning.  Safety maintained with q15 minute checks.

## 2017-08-26 NOTE — Progress Notes (Signed)
D.  Pt pleasant on approach, denies complaints at this time.  Pt was positive for evening wrap up group, observed engaged in appropriate interaction with peers on the unit.  Pt denies SI/HI/AVH at this time.  Pt denies need for prn for sleep tonight.  A.  Support and encouragement offered, medication given as ordered  R.  Pt remains safe on the unit, will continue to monitor.

## 2017-08-26 NOTE — Progress Notes (Addendum)
[facility] MD Progress Note  08/26/2017 1:13 PM Casey Dennis  MRN:  [id number] Subjective: Patient states "I feel all right today".  States she had a good visit from her husband yesterday, and feels she has a good support system. Today denies any suicidal ideations, and presents future oriented, expressing hope to be discharged soon in order to reunite with her husband and daughter. Objective: I have reviewed the chart notes and have met with patient. 27 year old female, reports recent acute onset/time-limited episode of severe anxiety physical symptoms such as shortness of breath, headache, which coursed with suicidal ideations.  Description is suggestive of a severe panic attack and states she has had prior panic attacks although not as severe.  Reports she was recently diagnosed with bipolar disorder and was being managed with low-dose risperidone which she thinks has caused or contributed to increased anxiety and above episodes .  Of note, has not endorsed any clear history of mania or hypomania, has endorsed prior episodes of depression which she characterizes as mild. Currently she is on any standing psychiatric medication, is not interested in starting any new antidepressant or psychiatric management for anxiety, stating that she is more interested in psychotherapy. Today presents calm, pleasant on approach, does not appear anxious or agitated, states she is feeling better, is focused on discharge soon. Denies suicidal ideations Labs reviewed- TSH WNL. Principal Problem: Depression, Panic Attacks Diagnosis:   Patient Active Problem List   Diagnosis Date Noted  . Bipolar disorder, unspecified ([place]) [F31.9] 08/25/2017  . Normal labor [O80, Z37.9] 06/23/2014   Total Time spent with patient: 20 minutes  Past Psychiatric History:   Past Medical History:  Past Medical History:  Diagnosis Date  . Allergy   . Anemia   . Anxiety   . Depression   . Medical history non-contributory     Past  Surgical History:  Procedure Laterality Date  . NO PAST SURGERIES     Family History:  Family History  Problem Relation Age of Onset  . Cancer Father   . Diabetes Mother   . Mental illness Sister   . Mental illness Brother    Family Psychiatric  History:  Social History:  Social History   Substance and Sexual Activity  Alcohol Use No     Social History   Substance and Sexual Activity  Drug Use No    Social History   Socioeconomic History  . Marital status: Legally Separated    Spouse name: Not on file  . Number of children: 1  . Years of education: Not on file  . Highest education level: Not on file  Occupational History  . Not on file  Social Needs  . Financial resource strain: Not on file  . Food insecurity:    Worry: Patient refused    Inability: Never true  . Transportation needs:    Medical: No    Non-medical: No  Tobacco Use  . Smoking status: Never Smoker  . Smokeless tobacco: Never Used  Substance and Sexual Activity  . Alcohol use: No  . Drug use: No  . Sexual activity: Yes    Birth control/protection: None  Lifestyle  . Physical activity:    Days per week: Not on file    Minutes per session: Not on file  . Stress: Not on file  Relationships  . Social connections:    Talks on phone: Not on file    Gets together: Not on file    Attends religious service: Not on  file    Active member of club or organization: Not on file    Attends meetings of clubs or organizations: Not on file    Relationship status: Not on file  Other Topics Concern  . Not on file  Social History Narrative  . Not on file   Additional Social History:    Pain Medications: see MAR Prescriptions: see MAR Over the Counter: see MAR History of alcohol / drug use?: No history of alcohol / drug abuse Negative Consequences of Use: Financial  Sleep: Improved  Appetite:  Improved  Current Medications: Current Facility-Administered Medications  Medication Dose Route  Frequency Provider Last Rate Last Dose  . acetaminophen (TYLENOL) tablet 650 mg  650 mg Oral Q6H PRN Money, Lowry Ram, FNP      . alum & mag hydroxide-simeth (MAALOX/MYLANTA) 200-200-20 MG/5ML suspension 30 mL  30 mL Oral Q4H PRN Money, Lowry Ram, FNP      . LORazepam (ATIVAN) tablet 1 mg  1 mg Oral Q8H PRN Courteny Egler A, MD      . magnesium hydroxide (MILK OF MAGNESIA) suspension 30 mL  30 mL Oral Daily PRN Money, Lowry Ram, FNP      . propranolol (INDERAL) tablet 10 mg  10 mg Oral BID Rondi Ivy, Myer Peer, MD   10 mg at 08/26/17 1153  . traZODone (DESYREL) tablet 50 mg  50 mg Oral QHS PRN Money, Lowry Ram, FNP        Lab Results:  Results for orders placed or performed during the hospital encounter of 08/25/17 (from the past 48 hour(s))  TSH     Status: None   Collection Time: 08/26/17  6:39 AM  Result Value Ref Range   TSH 3.407 0.350 - 4.500 uIU/mL    Comment: Performed by a 3rd Generation assay with a functional sensitivity of <=0.01 uIU/mL. Performed at [facility], [place] [address] [place], [place] [address]     Blood Alcohol level:  Lab Results  Component Value Date   ETH <10 56/70/1410    Metabolic Disorder Labs: No results found for: HGBA1C, MPG No results found for: PROLACTIN No results found for: CHOL, TRIG, HDL, CHOLHDL, VLDL, LDLCALC  Physical Findings: AIMS: Facial and Oral Movements Muscles of Facial Expression: None, normal Lips and Perioral Area: None, normal Jaw: None, normal Tongue: None, normal,Extremity Movements Upper (arms, wrists, hands, fingers): None, normal Lower (legs, knees, ankles, toes): None, normal, Trunk Movements Neck, shoulders, hips: None, normal, Overall Severity Severity of abnormal movements (highest score from questions above): None, normal Incapacitation due to abnormal movements: None, normal Patient's awareness of abnormal movements (rate only patient's report): No Awareness, Dental Status Current problems with  teeth and/or dentures?: No Does patient usually wear dentures?: No  CIWA:  CIWA-Ar Total: 0 COWS:  COWS Total Score: 0  Musculoskeletal: Strength & Muscle Tone: within normal limits Gait & Station: normal Patient leans: N/A  Psychiatric Specialty Exam: Physical Exam  ROS denies headache, no chest pain, no shortness of breath, no nausea, no vomiting  Blood pressure (!) 115/57, pulse 66, temperature 98.4 F (36.9 C), temperature source Oral, resp. rate 20, height 5' 1" (1.549 m), weight 60.3 kg, SpO2 100 %, unknown if currently breastfeeding.Body mass index is 25.13 kg/m.  General Appearance: Well Groomed  Eye Contact:  Good  Speech:  Normal Rate  Volume:  Normal  Mood:  Reports mood is improved, minimizes depression at this time  Affect:  Appropriate and Reactive  Thought Process:  Linear and Descriptions of Associations: Intact  Orientation:  Other:  Fully alert and attentive  Thought Content:  Denies hallucinations, no delusions are expressed  Suicidal Thoughts:  No-denies suicidal ideations, no self-injurious ideations, contracts for safety on unit at this time  Homicidal Thoughts:  No  Memory:  Recent and remote grossly intact  Judgement:  Other:  Fair/improving  Insight:  Fair  Psychomotor Activity:  Normal-no current psychomotor agitation or restlessness  Concentration:  Concentration: Good and Attention Span: Good  Recall:  Good  Fund of Knowledge:  Good  Language:  Good  Akathisia:  Negative  Handed:  Right  AIMS (if indicated):     Assets:  Communication Skills Desire for Improvement Resilience  ADL's:  Intact  Cognition:  WNL  Sleep:  Number of Hours: 5.25   Assessment -27 year old female, presented following an acute episode of severe anxiety which coursed with suicidal ideations.  Reports recent increased anxiety in the context of significant psychosocial stressors.  Recently diagnosed with bipolar disorder, was being managed with low-dose Risperidone, but  patient has opted to discontinue this medication as she feels that it has contributed to increased anxiety and a recent episode.  At this time does not endorse any clear history of mania or hypomania and does not appear with current manic symptoms.  Currently not on any standing psychiatric medications, except for propranolol for anxiety, which she is tolerating well thus far.  Not currently interested in adding any other standing psychiatric medications, interested in psychotherapy.  Today presents calm without current symptoms of severe anxiety.  No suicidal ideations.  Treatment Plan Summary: Daily contact with patient to assess and evaluate symptoms and progress in treatment, Medication management, Plan Inpatient treatment and Medications as below  Encourage group and milieu participation to work on coping skills and symptom reduction Continue Inderal 10 mg twice a day for anxiety Continue Trazodone 50 mg nightly PRN for insomnia as needed Continue Ativan 1 mg every 8 hours PRN for anxiety as needed Treatment team working on disposition planning options  Jenne Campus, MD 08/26/2017, 1:13 PM

## 2017-08-27 NOTE — Progress Notes (Signed)
Patient ID: Casey Dennis, female   DOB: Nov 21, 1990, 27 y.o.   MRN: [id number]    D: Pt has been appropriate on the unit today, she attended all groups and engaged in treatment. Pt reported that she was ready for discharge. Pt reported that her depression was a 0, her hopelessness was a 0, and her anxiety was a 0. Pt reported that her goal was to have and focus on good thoughts. Pt refused all medication today on the unit, she reported that she did not need it. Pt reported being negative SI/HI, no AH/VH noted. A: 15 min checks continued for patient safety. R: Pt safety maintained.

## 2017-08-27 NOTE — [facility] Group Notes (Signed)
[facility] Group Notes:  (Nursing/MHT/Case Management/Adjunct)  Date:  08/27/2017  Time:  2:00 PM  Type of Therapy:  Psychoeducational Skills  Participation Level:  Active  Participation Quality:  Appropriate  Affect:  Appropriate  Cognitive:  Appropriate  Insight:  Appropriate  Engagement in Group:  Engaged  Modes of Intervention:  Problem-solving  Summary of Progress/Problems: Pt attended Psychoeducational group with top topic healthy support systems.   Benancio Deeds Shanta 08/27/2017, 2:00 PM

## 2017-08-27 NOTE — Progress Notes (Signed)
Patient ID: Casey Dennis, female   DOB: Nov 09, 1990, 27 y.o.   MRN: [id number] D: Patient in dayroom on approach. Pt appears calm and cooperative. Pt mood and affect appears depressed. Pt  reports she is doing well and looking forward to discharge. Pt denies SI/HI/AVH and pain. Pt attended and participated in evening wrap up group.  A: Medications administered as prescribed. Support and encouragement provided as needed. R: Patient remains safe and complaint with medications.

## 2017-08-27 NOTE — Progress Notes (Signed)
[facility] MD Progress Note  08/27/2017 12:46 PM Casey Dennis  MRN:  [id number] Subjective: patient reports some anxiety, but states that in general she is feeling better than she did prior to admission and that she has not had any acute episodes of severe anxiety. Denies any suicidal ideations, and is future oriented, hoping for discharge soon in order to reunite with her SO/child. Currently on los dose Propranolol for anxiety symptoms- states she is tolerating this medication well, denies side effects. Objective: I have reviewed the chart notes and have met with patient. 27 year old female, reports recent acute onset/time-limited episode of severe anxiety which coursed with physical symptoms such as shortness of breath, headache,and which was associated with acute suicidal ideations.  Description is suggestive of a severe panic attack and states she has had prior panic attacks although not as severe.  Reports she was recently diagnosed with bipolar disorder and was being managed with low-dose risperidone which she thinks has caused or contributed to increased anxiety and above episodes .   Psychiatric history reviewed - as noted, patient reports she had recently been diagnosed with Bipolar Disorder - patient does not currently endorse any clear history of mania or hypomania, does endorse brief , short lived mood swings, and describes brief episodes of explosiveness, angry outbursts during arguments , usually lasting only minutes .  Behavior on unit has been in good control, no disruptive or agitated/angry episodes or behaviors. No current presentation of mania- no psychomotor agitation or restlessness, no flight of ideations, no pressured speech, good sleep. Denies suicidal ideations. As reviewed in prior notes, patient not interested in other psychiatric medication trials ( other than propranolol for anxiety symptoms, which she states is helpful and well tolerated ) and interested in getting outpatient  psychotherapy . Currently she is on any standing psychiatric medication, is not interested in starting any new antidepressant or psychiatric management for anxiety, stating that she is more interested in psychotherapy.  Principal Problem: Depression, Panic Attacks Diagnosis:   Patient Active Problem List   Diagnosis Date Noted  . Bipolar disorder, unspecified ([place]) [F31.9] 08/25/2017  . Normal labor [O80, Z37.9] 06/23/2014   Total Time spent with patient: 20 minutes  Past Psychiatric History:   Past Medical History:  Past Medical History:  Diagnosis Date  . Allergy   . Anemia   . Anxiety   . Depression   . Medical history non-contributory     Past Surgical History:  Procedure Laterality Date  . NO PAST SURGERIES     Family History:  Family History  Problem Relation Age of Onset  . Cancer Father   . Diabetes Mother   . Mental illness Sister   . Mental illness Brother    Family Psychiatric  History:  Social History:  Social History   Substance and Sexual Activity  Alcohol Use No     Social History   Substance and Sexual Activity  Drug Use No    Social History   Socioeconomic History  . Marital status: Legally Separated    Spouse name: Not on file  . Number of children: 1  . Years of education: Not on file  . Highest education level: Not on file  Occupational History  . Not on file  Social Needs  . Financial resource strain: Not on file  . Food insecurity:    Worry: Patient refused    Inability: Never true  . Transportation needs:    Medical: No    Non-medical: No  Tobacco  Use  . Smoking status: Never Smoker  . Smokeless tobacco: Never Used  Substance and Sexual Activity  . Alcohol use: No  . Drug use: No  . Sexual activity: Yes    Birth control/protection: None  Lifestyle  . Physical activity:    Days per week: Not on file    Minutes per session: Not on file  . Stress: Not on file  Relationships  . Social connections:    Talks on phone:  Not on file    Gets together: Not on file    Attends religious service: Not on file    Active member of club or organization: Not on file    Attends meetings of clubs or organizations: Not on file    Relationship status: Not on file  Other Topics Concern  . Not on file  Social History Narrative  . Not on file   Additional Social History:    Pain Medications: see MAR Prescriptions: see MAR Over the Counter: see MAR History of alcohol / drug use?: No history of alcohol / drug abuse Negative Consequences of Use: Financial  Sleep: Good  Appetite:  Good  Current Medications: Current Facility-Administered Medications  Medication Dose Route Frequency Provider Last Rate Last Dose  . acetaminophen (TYLENOL) tablet 650 mg  650 mg Oral Q6H PRN Money, Travis B, FNP   650 mg at 08/27/17 0934  . alum & mag hydroxide-simeth (MAALOX/MYLANTA) 200-200-20 MG/5ML suspension 30 mL  30 mL Oral Q4H PRN Money, Travis B, FNP      . LORazepam (ATIVAN) tablet 1 mg  1 mg Oral Q8H PRN ,  A, MD   1 mg at 08/26/17 2306  . magnesium hydroxide (MILK OF MAGNESIA) suspension 30 mL  30 mL Oral Daily PRN Money, Travis B, FNP      . propranolol (INDERAL) tablet 10 mg  10 mg Oral BID ,  A, MD   10 mg at 08/26/17 1153  . traZODone (DESYREL) tablet 50 mg  50 mg Oral QHS PRN Money, Travis B, FNP        Lab Results:  Results for orders placed or performed during the hospital encounter of 08/25/17 (from the past 48 hour(s))  TSH     Status: None   Collection Time: 08/26/17  6:39 AM  Result Value Ref Range   TSH 3.407 0.350 - 4.500 uIU/mL    Comment: Performed by a 3rd Generation assay with a functional sensitivity of <=0.01 uIU/mL. Performed at [facility], [address] [place], [place] [address]     Blood Alcohol level:  Lab Results  Component Value Date   ETH <10 08/24/2017    Metabolic Disorder Labs: No results found for: HGBA1C, MPG No results found for:  PROLACTIN No results found for: CHOL, TRIG, HDL, CHOLHDL, VLDL, LDLCALC  Physical Findings: AIMS: Facial and Oral Movements Muscles of Facial Expression: None, normal Lips and Perioral Area: None, normal Jaw: None, normal Tongue: None, normal,Extremity Movements Upper (arms, wrists, hands, fingers): None, normal Lower (legs, knees, ankles, toes): None, normal, Trunk Movements Neck, shoulders, hips: None, normal, Overall Severity Severity of abnormal movements (highest score from questions above): None, normal Incapacitation due to abnormal movements: None, normal Patient's awareness of abnormal movements (rate only patient's report): No Awareness, Dental Status Current problems with teeth and/or dentures?: No Does patient usually wear dentures?: No  CIWA:  CIWA-Ar Total: 0 COWS:  COWS Total Score: 0  Musculoskeletal: Strength & Muscle Tone: within normal limits Gait &   Station: normal Patient leans: N/A  Psychiatric Specialty Exam: Physical Exam  ROS denies headache, no chest pain, no shortness of breath, no nausea, no vomiting  Blood pressure 108/70, pulse 80, temperature 98.2 F (36.8 C), temperature source Oral, resp. rate 20, height 5' 1" (1.549 m), weight 60.3 kg, SpO2 100 %, unknown if currently breastfeeding.Body mass index is 25.13 kg/m.  General Appearance: Well Groomed  Eye Contact:  Good  Speech:  Normal Rate  Volume:  Normal  Mood:  denies depression, states mood is improved   Affect:  appropriate, slightly anxious, affect improves during session, smiles at times appropriately, no expansive or irritable affect noted   Thought Process:  Linear and Descriptions of Associations: Intact  Orientation:  Other:  Fully alert and attentive  Thought Content:  Denies hallucinations, no delusions are expressed  Suicidal Thoughts:  No-denies suicidal ideations, no self-injurious ideations, contracts for safety on unit at this time  Homicidal Thoughts:  No  Memory:  Recent and  remote grossly intact  Judgement:  Other:  Fair/improving  Insight:  Fair and improving  Psychomotor Activity:  Normal-no current psychomotor agitation or restlessness  Concentration:  Concentration: Good and Attention Span: Good  Recall:  Good  Fund of Knowledge:  Good  Language:  Good  Akathisia:  Negative  Handed:  Right  AIMS (if indicated):     Assets:  Communication Skills Desire for Improvement Resilience  ADL's:  Intact  Cognition:  WNL  Sleep:  Number of Hours: 5.25   Assessment -27 year old female, presented following an acute episode of severe anxiety which coursed with suicidal ideations.  Reports recent increased anxiety in the context of significant psychosocial stressors.  Recently diagnosed with bipolar disorder, was being managed with low-dose Risperidone, but patient has opted to discontinue this medication as she feels that it has contributed to recent increased anxiety. Currently patient reports feeling better. Denies/does not present with symptoms of mania or hypomania. Describes history of brief angry outbursts rather than of sustained hypomania or mania. Does endorse anxiety symptoms, improved on B blocker . Not interested in further /other psychiatric medications. Interested in psychotherapy as main treatment modality following discharge.  Treatment Plan Summary: Daily contact with patient to assess and evaluate symptoms and progress in treatment, Medication management, Plan Inpatient treatment and Medications as below  Treatment Plan reviewed as below today 8/11  Encourage group and milieu participation to work on coping skills and symptom reduction Continue Inderal 10 mg twice a day for anxiety Continue Trazodone 50 mg nightly PRN for insomnia as needed Continue Ativan 1 mg every 8 hours PRN for anxiety as needed Treatment team working on disposition planning options  Jenne Campus, MD 08/27/2017, 12:46 PM   Patient ID: Casey Dennis, female   DOB:  01-Sep-1990, 27 y.o.   MRN: [id number]

## 2017-08-27 NOTE — [facility] Group Notes (Signed)
[facility] LCSW Group Therapy Note  Date/Time:  08/27/2017 10:00-11:00AM  Type of Therapy and Topic:  Group Therapy:  Healthy and Unhealthy Supports  Participation Level:  Active   Description of Group:  Patients in this group were introduced to the idea of adding a variety of healthy supports to address the various needs in their lives.Patients discussed what additional healthy supports could be helpful in their recovery and wellness after discharge in order to prevent future hospitalizations.   An emphasis was placed on using counselor, doctor, therapy groups, 12-step groups, and problem-specific support groups to expand supports.    Therapeutic Goals:   1)  discuss importance of adding supports to stay well once out of the hospital  2)  compare healthy versus unhealthy supports and identify some examples of each  3)  generate ideas and descriptions of healthy supports that can be added  4)  offer mutual support about how to help supports to understand the mental health issues faced   5)  encourage active participation in and adherence to discharge plan    Summary of Patient Progress:  The patient stated that current healthy supports in her life are her husband, 14yo daughter, 2 sisters and 2 best friends while current unhealthy supports include her mother and father who try but do not understand mental illness because of their cultural backgrounds as [place].  The patient expressed a willingness to add whatever is necessary as support(s) to help in her recovery journey.   Therapeutic Modalities:   Motivational Interviewing Brief Solution-Focused Therapy  Selmer Dominion, LCSW

## 2017-08-28 DIAGNOSIS — R45851 Suicidal ideations: Secondary | ICD-10-CM

## 2017-08-28 MED ORDER — TRAZODONE HCL 50 MG PO TABS: 50.0000 mg | ORAL_TABLET | Freq: Every evening | ORAL | 0 refills | Status: DC | PRN

## 2017-08-28 MED ORDER — PROPRANOLOL HCL 10 MG PO TABS: 10.0000 mg | ORAL_TABLET | Freq: Two times a day (BID) | ORAL | 0 refills | Status: DC

## 2017-08-28 NOTE — Plan of Care (Signed)
  Problem: Education: Goal: Knowledge of [facility] Education information/materials will improve Outcome: Completed/Met Goal: Emotional status will improve Outcome: Completed/Met Goal: Mental status will improve Outcome: Completed/Met Goal: Verbalization of understanding the information provided will improve Outcome: Completed/Met   Problem: Activity: Goal: Interest or engagement in activities will improve Outcome: Completed/Met Goal: Sleeping patterns will improve Outcome: Completed/Met   Problem: Coping: Goal: Ability to verbalize frustrations and anger appropriately will improve Outcome: Completed/Met Goal: Ability to demonstrate self-control will improve Outcome: Completed/Met   Problem: Health Behavior/Discharge Planning: Goal: Identification of resources available to assist in meeting health care needs will improve Outcome: Completed/Met Goal: Compliance with treatment plan for underlying cause of condition will improve Outcome: Completed/Met   Problem: Physical Regulation: Goal: Ability to maintain clinical measurements within normal limits will improve Outcome: Completed/Met   Problem: Safety: Goal: Periods of time without injury will increase Outcome: Completed/Met   Problem: Education: Goal: Utilization of techniques to improve thought processes will improve Outcome: Completed/Met Goal: Knowledge of the prescribed therapeutic regimen will improve Outcome: Completed/Met   Problem: Activity: Goal: Interest or engagement in leisure activities will improve Outcome: Completed/Met Goal: Imbalance in normal sleep/wake cycle will improve Outcome: Completed/Met   Problem: Coping: Goal: Coping ability will improve Outcome: Completed/Met Goal: Will verbalize feelings Outcome: Completed/Met   Problem: Education: Goal: Ability to state activities that reduce stress will improve Outcome: Completed/Met   Problem: Coping: Goal: Ability to identify and  develop effective coping behavior will improve Outcome: Completed/Met   Problem: Self-Concept: Goal: Ability to identify factors that promote anxiety will improve Outcome: Completed/Met Goal: Level of anxiety will decrease Outcome: Completed/Met Goal: Ability to modify response to factors that promote anxiety will improve Outcome: Completed/Met   Problem: Activity: Goal: Will identify at least one activity in which they can participate Outcome: Completed/Met   Problem: Coping: Goal: Ability to identify and develop effective coping behavior will improve Outcome: Completed/Met Goal: Ability to interact with others will improve Outcome: Completed/Met Goal: Demonstration of participation in decision-making regarding own care will improve Outcome: Completed/Met Goal: Ability to use eye contact when communicating with others will improve Outcome: Completed/Met   Problem: Education: Goal: Ability to make informed decisions regarding treatment will improve Outcome: Completed/Met   Problem: Coping: Goal: Coping ability will improve Outcome: Completed/Met   Problem: Health Behavior/Discharge Planning: Goal: Identification of resources available to assist in meeting health care needs will improve Outcome: Completed/Met   Problem: Medication: Goal: Compliance with prescribed medication regimen will improve Outcome: Completed/Met   Problem: Self-Concept: Goal: Ability to disclose and discuss suicidal ideas will improve Outcome: Completed/Met Goal: Will verbalize positive feelings about self Outcome: Completed/Met

## 2017-08-28 NOTE — Tx Team (Signed)
Interdisciplinary Treatment and Diagnostic Plan Update  08/28/2017 Time of Session: 10:20a Casey Dennis MRN: [id number]  Principal Diagnosis: <principal problem not specified>  Secondary Diagnoses: Active Problems:   Bipolar disorder, unspecified ([place])   Current Medications:  Current Facility-Administered Medications  Medication Dose Route Frequency Provider Last Rate Last Dose  . acetaminophen (TYLENOL) tablet 650 mg  650 mg Oral Q6H PRN Money, Lowry Ram, FNP   650 mg at 08/27/17 0934  . alum & mag hydroxide-simeth (MAALOX/MYLANTA) 200-200-20 MG/5ML suspension 30 mL  30 mL Oral Q4H PRN Money, Lowry Ram, FNP      . LORazepam (ATIVAN) tablet 1 mg  1 mg Oral Q8H PRN Cobos, Myer Peer, MD   1 mg at 08/26/17 2306  . magnesium hydroxide (MILK OF MAGNESIA) suspension 30 mL  30 mL Oral Daily PRN Money, Lowry Ram, FNP      . propranolol (INDERAL) tablet 10 mg  10 mg Oral BID Cobos, Myer Peer, MD   10 mg at 08/28/17 0818  . traZODone (DESYREL) tablet 50 mg  50 mg Oral QHS PRN Money, Lowry Ram, FNP   50 mg at 08/27/17 2112   PTA Medications: Medications Prior to Admission  Medication Sig Dispense Refill Last Dose  . azelastine (ASTELIN) 0.1 % nasal spray Place 2 sprays into both nostrils 2 (two) times daily. Use in each nostril as directed (Patient not taking: Reported on 08/24/2017) 30 mL 0 Unknown at Unknown time  . benzonatate (TESSALON) 100 MG capsule Take 1-2 capsules (100-200 mg total) by mouth 3 (three) times daily as needed for cough. (Patient not taking: Reported on 08/24/2017) 40 capsule 0 Unknown at Unknown time  . Dextromethorphan-Guaifenesin (MUCINEX DM MAXIMUM STRENGTH) 60-1200 MG TB12 Take 1 tablet by mouth every 12 (twelve) hours. (Patient not taking: Reported on 08/24/2017) 20 each 0 Unknown at Unknown time  . HYDROcodone-homatropine (HYCODAN) 5-1.5 MG/5ML syrup Take 5 mLs by mouth every 8 (eight) hours as needed for cough. (Patient not taking: Reported on 08/24/2017) 120 mL 0 Unknown at  Unknown time  . propranolol (INDERAL) 10 MG tablet Take 10 mg by mouth 2 (two) times daily.    Unknown at Unknown time  . risperiDONE (RISPERDAL) 0.25 MG tablet Take 0.25 mg by mouth at bedtime.   Unknown at Unknown time    Patient Stressors: Educational concerns Financial difficulties Health problems  Patient Strengths: Ability for insight Active sense of humor Average or above average intelligence  Treatment Modalities: Medication Management, Group therapy, Case management,  1 to 1 session with clinician, Psychoeducation, Recreational therapy.   Physician Treatment Plan for Primary Diagnosis: <principal problem not specified> Long Term Goal(s): Improvement in symptoms so as ready for discharge Improvement in symptoms so as ready for discharge   Short Term Goals: Ability to identify changes in lifestyle to reduce recurrence of condition will improve Ability to verbalize feelings will improve Ability to disclose and discuss suicidal ideas Ability to demonstrate self-control will improve Ability to identify and develop effective coping behaviors will improve Ability to maintain clinical measurements within normal limits will improve Ability to identify changes in lifestyle to reduce recurrence of condition will improve Ability to maintain clinical measurements within normal limits will improve  Medication Management: Evaluate patient's response, side effects, and tolerance of medication regimen.  Therapeutic Interventions: 1 to 1 sessions, Unit Group sessions and Medication administration.  Evaluation of Outcomes: Adequate for Discharge  Physician Treatment Plan for Secondary Diagnosis: Active Problems:   Bipolar disorder, unspecified ([name])  Long  Term Goal(s): Improvement in symptoms so as ready for discharge Improvement in symptoms so as ready for discharge   Short Term Goals: Ability to identify changes in lifestyle to reduce recurrence of condition will improve Ability to  verbalize feelings will improve Ability to disclose and discuss suicidal ideas Ability to demonstrate self-control will improve Ability to identify and develop effective coping behaviors will improve Ability to maintain clinical measurements within normal limits will improve Ability to identify changes in lifestyle to reduce recurrence of condition will improve Ability to maintain clinical measurements within normal limits will improve     Medication Management: Evaluate patient's response, side effects, and tolerance of medication regimen.  Therapeutic Interventions: 1 to 1 sessions, Unit Group sessions and Medication administration.  Evaluation of Outcomes: Adequate for Discharge   RN Treatment Plan for Primary Diagnosis: <principal problem not specified> Long Term Goal(s): Knowledge of disease and therapeutic regimen to maintain health will improve  Short Term Goals: Ability to verbalize feelings will improve, Ability to disclose and discuss suicidal ideas, Ability to identify and develop effective coping behaviors will improve and Compliance with prescribed medications will improve  Medication Management: RN will administer medications as ordered by provider, will assess and evaluate patient's response and provide education to patient for prescribed medication. RN will report any adverse and/or side effects to prescribing provider.  Therapeutic Interventions: 1 on 1 counseling sessions, Psychoeducation, Medication administration, Evaluate responses to treatment, Monitor vital signs and CBGs as ordered, Perform/monitor CIWA, COWS, AIMS and Fall Risk screenings as ordered, Perform wound care treatments as ordered.  Evaluation of Outcomes: Adequate for Discharge   LCSW Treatment Plan for Primary Diagnosis: <principal problem not specified> Long Term Goal(s): Safe transition to appropriate next level of care at discharge, Engage patient in therapeutic group addressing interpersonal  concerns.  Short Term Goals: Engage patient in aftercare planning with referrals and resources  Therapeutic Interventions: Assess for all discharge needs, 1 to 1 time with Social worker, Explore available resources and support systems, Assess for adequacy in community support network, Educate family and significant other(s) on suicide prevention, Complete Psychosocial Assessment, Interpersonal group therapy.  Evaluation of Outcomes: Adequate for Discharge   Progress in Treatment: Attending groups: Yes. Participating in groups: Yes. Taking medication as prescribed: Yes. Toleration medication: Yes. Family/Significant other contact made: No, will contact:  if patient consents Patient understands diagnosis: Yes. Discussing patient identified problems/goals with staff: Yes. Medical problems stabilized or resolved: Yes. Denies suicidal/homicidal ideation: Yes. Issues/concerns per patient self-inventory: No. Other:   New problem(s) identified:None   New Short Term/Long Term Goal(s): medication stabilization, elimination of SI thoughts, development of comprehensive mental wellness plan.   Patient Goals: "I learned how to accept stress and how to manage it. I also learned other coping skills"  Discharge Plan or Barriers: Patient discharged home with her husband and has follow up with [facility] for outpatient psychiatric services.   Reason for Continuation of Hospitalization: None  Estimated Length of Stay: Discharging, 08/28/17  Attendees: Patient: Casey Dennis 08/28/2017 10:10 AM  Physician: Dr. Neita Garnet, MD 08/28/2017 10:10 AM  Nursing: Chrys Racer, RN 08/28/2017 10:10 AM  RN Care Manager:X 08/28/2017 10:10 AM  Social Worker: Radonna Ricker, Dorris 08/28/2017 10:10 AM  Recreational Therapist: X 08/28/2017 10:10 AM  Other: X 08/28/2017 10:10 AM  Other: X 08/28/2017 10:10 AM  Other:X 08/28/2017 10:10 AM    Scribe for Treatment Team: Marylee Floras,  [place] 08/28/2017 10:10 AM

## 2017-08-28 NOTE — Discharge Summary (Addendum)
Physician Discharge Summary Note  Patient:  Casey Dennis is an 27 y.o., female MRN:  [id number] DOB:  08-03-1990 Patient phone:  [phone] (home)  Patient address:   [place]  [address],  Total Time spent with patient: 20 minutes  Date of Admission:  08/25/2017 Date of Discharge: 08/28/2017  Reason for Admission:  Per assessment note: 27 year old married female. Reports that on 8/8 she went to ED accompanying a nephew who had cut self on hand . While in ED she reports she had an acute onset episode of " feeling terrible", with acute anxiety, feeling smothered and unable to breathe well, feeling " like my head was exploding", severe anxiety, and thoughts of killing self. States these were " almost like voices, but were my own thoughts" . States that symptoms were intense , " very scary", subsided after about an hour . Patient acknowledges she has had been experiencing some depression over recent days, but characterizes as relatively mild and states that in general she feels she was " OK" up to episode described above. She states she has been facing some stressors , to include father being medically ill, recently quitting her job ( because father can no longer babysit for her child), but states she feels that main issue is that she was recently diagnosed with Bipolar Disorder and has been tried on psychiatric medications she has not tolerated well. States she was initially prescribed Abilify which caused nausea, and more recently has been on low dose Risperidone. She feels this medication has contributed to increased anxiety and recent episode as above . Of note, patient states she is feeling " a lot better" today, and denies current significant anxiety or distress . Endorses some neuro-vegetative symptoms as below, but denies severe anhedonia or persistently depressed mood, states she was not having suicidal ideations until episode as above  Admission BAL negative, admission UDS  negative  Principal Problem: Bipolar disorder, unspecified [facility]) Discharge Diagnoses: Patient Active Problem List   Diagnosis Date Noted  . Bipolar disorder, unspecified ([place]) [F31.9] 08/25/2017  . Normal labor [O80, Z37.9] 06/23/2014    Past Psychiatric History: Per assessment note- denies prior psychiatric admissions, denies history of suicide attempts or self injurious behaviors, denies history of psychosis. Does not endorse history of PTSD. Endorses episodes of depression, which she states have been brief ,generally mild. Reports she has been diagnosed with Bipolar Disorder in the past, does not endorse any clear history of mania or hypomania, does endorse brief mood swings, sometimes lasting only minutes to hours .  As above reports increased anxiety recently, panic attacks, no agoraphobia.  Past Medical History:  Past Medical History:  Diagnosis Date  . Allergy   . Anemia   . Anxiety   . Depression   . Medical history non-contributory     Past Surgical History:  Procedure Laterality Date  . NO PAST SURGERIES     Family History:  Family History  Problem Relation Age of Onset  . Cancer Father   . Diabetes Mother   . Mental illness Sister   . Mental illness Brother    Family Psychiatric  History:  Social History:  Social History   Substance and Sexual Activity  Alcohol Use No     Social History   Substance and Sexual Activity  Drug Use No    Social History   Socioeconomic History  . Marital status: Legally Separated    Spouse name: Not on file  . Number of children:  1  . Years of education: Not on file  . Highest education level: Not on file  Occupational History  . Not on file  Social Needs  . Financial resource strain: Not on file  . Food insecurity:    Worry: Patient refused    Inability: Never true  . Transportation needs:    Medical: No    Non-medical: No  Tobacco Use  . Smoking status: Never Smoker  . Smokeless tobacco: Never Used  Substance  and Sexual Activity  . Alcohol use: No  . Drug use: No  . Sexual activity: Yes    Birth control/protection: None  Lifestyle  . Physical activity:    Days per week: Not on file    Minutes per session: Not on file  . Stress: Not on file  Relationships  . Social connections:    Talks on phone: Not on file    Gets together: Not on file    Attends religious service: Not on file    Active member of club or organization: Not on file    Attends meetings of clubs or organizations: Not on file    Relationship status: Not on file  Other Topics Concern  . Not on file  Social History Narrative  . Not on file    Hospital Course:  Casey Dennis was admitted for Bipolar disorder, unspecified ([place])  and crisis management.  Pt was treated discharged with the medications listed below under Medication List.  Medical problems were identified and treated as needed.  Home medications were restarted as appropriate.  Improvement was monitored by observation and Casey Dennis 's daily report of symptom reduction.  Emotional and mental status was monitored by daily self-inventory reports completed by Casey Dennis and clinical staff.         Casey Dennis was evaluated by the treatment team for stability and plans for continued recovery upon discharge. Casey Dennis 's motivation was an integral factor for scheduling further treatment. Employment, transportation, bed availability, health status, family support, and any pending legal issues were also considered during hospital stay. Pt was offered further treatment options upon discharge including but not limited to Residential, Intensive Outpatient, and Outpatient treatment.  [facility] will follow up with the services as listed below under Follow Up Information.     Upon completion of this admission the patient was both mentally and medically stable for discharge denying suicidal/homicidal ideation, auditory/visual/tactile hallucinations, delusional thoughts and  paranoia.    Casey Dennis responded well to treatment with Inderal 10 mg and Trazodone 50 mg without adverse effects.  Pt demonstrated improvement without reported or observed adverse effects to the point of stability appropriate for outpatient management. Pertinent labs include: CBC and CMP , for which outpatient follow-up is necessary for lab recheck as mentioned below. Reviewed CBC, CMP, BAL, and UDS; all unremarkable aside from noted exceptions.   Physical Findings: AIMS: Facial and Oral Movements Muscles of Facial Expression: None, normal Lips and Perioral Area: None, normal Jaw: None, normal Tongue: None, normal,Extremity Movements Upper (arms, wrists, hands, fingers): None, normal Lower (legs, knees, ankles, toes): None, normal, Trunk Movements Neck, shoulders, hips: None, normal, Overall Severity Severity of abnormal movements (highest score from questions above): None, normal Incapacitation due to abnormal movements: None, normal Patient's awareness of abnormal movements (rate only patient's report): No Awareness, Dental Status Current problems with teeth and/or dentures?: No Does patient usually wear dentures?: No  CIWA:  CIWA-Ar Total: 0 COWS:  COWS Total Score: 0  Musculoskeletal:  Strength & Muscle Tone: within normal limits Gait & Station: normal Patient leans: N/A  Psychiatric Specialty Exam: See SRA by MD  Physical Exam  Nursing note and vitals reviewed. Constitutional: She appears well-developed.    Review of Systems  Psychiatric/Behavioral: Negative for depression (stable) and suicidal ideas. The patient is not nervous/anxious.     Blood pressure 112/69, pulse 88, temperature 98.2 F (36.8 C), temperature source Oral, resp. rate 20, height 5\' 1"  (1.549 m), weight 60.3 kg, SpO2 100 %, unknown if currently breastfeeding.Body mass index is 25.13 kg/m.  Have you used any form of tobacco in the last 30 days? (Cigarettes, Smokeless Tobacco, Cigars, and/or Pipes): No   Has this patient used any form of tobacco in the last 30 days? (Cigarettes, Smokeless Tobacco, Cigars, and/or Pipes) No  Blood Alcohol level:  Lab Results  Component Value Date   ETH <10 69/62/9528    Metabolic Disorder Labs:  No results found for: HGBA1C, MPG No results found for: PROLACTIN No results found for: CHOL, TRIG, HDL, CHOLHDL, VLDL, LDLCALC  See Psychiatric Specialty Exam and Suicide Risk Assessment completed by Attending Physician prior to discharge.  Discharge destination:  Home  Is patient on multiple antipsychotic therapies at discharge:  No   Has Patient had three or more failed trials of antipsychotic monotherapy by history:  No  Recommended Plan for Multiple Antipsychotic Therapies: NA  Discharge Instructions    Diet - low sodium heart healthy   Complete by:  As directed    Discharge instructions   Complete by:  As directed    Take all medications as prescribed. Keep all follow-up appointments as scheduled.  Do not consume alcohol or use illegal drugs while on prescription medications. Report any adverse effects from your medications to your primary care provider promptly.  In the event of recurrent symptoms or worsening symptoms, call 911, a crisis hotline, or go to the nearest emergency department for evaluation.   Increase activity slowly   Complete by:  As directed      Allergies as of 08/28/2017   No Known Allergies     Medication List    STOP taking these medications   azelastine 0.1 % nasal spray Commonly known as:  ASTELIN   benzonatate 100 MG capsule Commonly known as:  TESSALON   HYDROcodone-homatropine 5-1.5 MG/5ML syrup Commonly known as:  HYCODAN   MUCINEX DM MAXIMUM STRENGTH 60-1200 MG Tb12   risperiDONE 0.25 MG tablet Commonly known as:  RISPERDAL     TAKE these medications     Indication  propranolol 10 MG tablet Commonly known as:  INDERAL Take 1 tablet (10 mg total) by mouth 2 (two) times daily.  Indication:   Migraine Headache   traZODone 50 MG tablet Commonly known as:  DESYREL Take 1 tablet (50 mg total) by mouth at bedtime as needed for sleep.  Indication:  Trouble Sleeping        Follow-up recommendations:  Activity:  as tolerated Diet:  heart healthy  Comments: Take all medications as prescribed. Keep all follow-up appointments as scheduled.  Do not consume alcohol or use illegal drugs while on prescription medications. Report any adverse effects from your medications to your primary care provider promptly.  In the event of recurrent symptoms or worsening symptoms, call 911, a crisis hotline, or go to the nearest emergency department for evaluation.    Derrill Center, NP 08/28/2017, 10:29 AM   Patient seen, Suicide Assessment Completed.  Disposition Plan Reviewed

## 2017-08-28 NOTE — [facility] Suicide Risk Assessment (Signed)
[place] INPATIENT:  Family/Significant Other Suicide Prevention Education  Suicide Prevention Education:  Education Completed; Harolyn Rutherford, husband, [phone],  has been identified by the patient as the family member/significant other with whom the patient will be residing, and identified as the person(s) who will aid the patient in the event of a mental health crisis (suicidal ideations/suicide attempt).  With written consent from the patient, the family member/significant other has been provided the following suicide prevention education, prior to the and/or following the discharge of the patient.  The suicide prevention education provided includes the following:  Suicide risk factors  Suicide prevention and interventions  National Suicide Hotline telephone number  [facility] assessment telephone number  [facility] Emergency Assistance [place] and/or Residential Mobile Crisis Unit telephone number  Request made of family/significant other to:  Remove weapons (e.g., guns, rifles, knives), all items previously/currently identified as safety concern.  No guns in the home.  Remove drugs/medications (over-the-counter, prescriptions, illicit drugs), all items previously/currently identified as a safety concern.  The family member/significant other verbalizes understanding of the suicide prevention education information provided.  The family member/significant other agrees to remove the items of safety concern listed above.  Derrill Center had several questions about pt diagnosis and follow up care.  Joanne Chars, LCSW 08/28/2017, 11:05 AM

## 2017-08-28 NOTE — Progress Notes (Signed)
  [facility] Adult Case Management Discharge Plan :  Will you be returning to the same living situation after discharge:  Yes,  with husband At discharge, do you have transportation home?: Yes,  husband Do you have the ability to pay for your medications: Yes,  BCBS  Release of information consent forms completed and in the chart;  Patient's signature needed at discharge.  Patient to Follow up at: Follow-up [facility]. Go on 09/04/2017.   Why:  Please attend your therapy intake appt with Alexia Freestone on Monday, 09/04/17, at 2:00pm.  Please contact the office by Tuesday, 8/13, to confirm the appt and pay the $20 deposit. Contact information: [place] [address] [phone]           Next level of care provider has access to [place] and Suicide Prevention discussed: Yes,  with husband  Have you used any form of tobacco in the last 30 days? (Cigarettes, Smokeless Tobacco, Cigars, and/or Pipes): No  Has patient been referred to the Quitline?: N/A patient is not a smoker  Patient has been referred for addiction treatment: N/A  Joanne Chars, LCSW 08/28/2017, 11:09 AM

## 2017-08-28 NOTE — Progress Notes (Signed)
Discharge note:  Patient discharged home per MD order.  Patient received all personal belongings from unit and locker.  She denies any thoughts of self harm.  Patient received prescriptions of her medications.  Reviewed AVS/transition record with patient and she indicated understanding.  Patient left ambulatory with her husband.

## 2017-08-28 NOTE — B[facility] Suicide Risk Assessment (Signed)
[facility] Discharge Suicide Risk Assessment   Principal Problem:  Anxiety, Panic Attack Discharge Diagnoses:  Patient Active Problem List   Diagnosis Date Noted  . Bipolar disorder, unspecified ([place]) [F31.9] 08/25/2017  . Normal labor [O80, Z37.9] 06/23/2014    Total Time spent with patient: 30 minutes  Musculoskeletal: Strength & Muscle Tone: within normal limits Gait & Station: normal Patient leans: N/A  Psychiatric Specialty Exam: ROS denies headache, no chest pain, no shortness of breath, no vomiting  Blood pressure 112/69, pulse 88, temperature 98.2 F (36.8 C), temperature source Oral, resp. rate 20, height 5\' 1"  (1.549 m), weight 60.3 kg, SpO2 100 %, unknown if currently breastfeeding.Body mass index is 25.13 kg/m.  General Appearance: Well Groomed  Eye Contact::  Good  Speech:  Normal Rate409  Volume:  Normal  Mood:  reports improved, " normal" mood, presents euthymic  Affect:  Appropriate and Full Range  Thought Process:  Linear and Descriptions of Associations: Intact  Orientation:  Full (Time, Place, and Person)  Thought Content:  no hallucinations, no delusions, not internally preoccupied   Suicidal Thoughts:  No denies suicidal or self injurious ideations, denies homicidal or violent ideations  Homicidal Thoughts:  No  Memory:  recent and remote grossly intact   Judgement:  Other:  improving  Insight:  improving  Psychomotor Activity:  Normal  Concentration:  Good  Recall:  Good  Fund of Knowledge:Good  Language: Good  Akathisia:  Negative  Handed:  Right  AIMS (if indicated):     Assets:  Communication Skills Desire for Improvement Resilience  Sleep:  Number of Hours: 6.75  Cognition: WNL  ADL's:  Intact   Mental Status Per Nursing Assessment::   On Admission:     Demographic Factors:  Married, has three year old child   Loss Factors: 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  Reports she was not  tolerating prescribed psychiatric medications well  Historical Factors: Denies history of prior psychiatric admissions, no history of prior suicide attempts or self injurious behaviors, reports panic attacks and some agoraphobia, reports she was not tolerating recently prescribed medication ( Risperidone) well   Risk Reduction Factors:   Responsible for children under 37 years of age, Living with another person, especially a relative, Positive social support and Positive coping skills or problem solving skills  Continued Clinical Symptoms:  At this time patient is alert, attentive, well related, pleasant on approach, no psychomotor agitation or restlessness, mood described as improved and currently denies feeling depressed, affect appropriate, reactive, no thought disorder, no suicidal or self injurious ideations, no homicidal or violent ideations, no homicidal or violent ideations, future oriented . Of note, has had no panic attacks during admission, presents calm. No symptoms of hypomania or mania noted- no pressured speech, no grandiosity, no irritability or expansiveness, no restlessness, no racing thoughts or loose associations, sleep improved. Behavior on unit in good control/Presents calm, pleasant on approach. As noted patient has expressed not wanting to be on any psychiatric medications other than current Propranolol for anxiety symptoms, which she tolerates well   Cognitive Features That Contribute To Risk:  No gross cognitive deficits noted upon discharge. Is alert , attentive, and oriented x 3   Suicide Risk:  Mild:  Suicidal ideation of limited frequency, intensity, duration, and specificity.  There are no identifiable plans, no associated intent, mild dysphoria and related symptoms, good self-control (both objective and subjective assessment), few other risk factors, and identifiable  protective factors, including available and accessible social support.    Plan Of Care/Follow-up  recommendations:  Activity:  as tolerated  Diet:  regular Tests:  NA Other:  See below  Patient is expressing readiness for discharge and is leaving unit in good spirits- no current grounds for involuntary commitment Plans to return home Plans to follow up with outpatient therapy   Jenne Campus, MD 08/28/2017, 8:41 AM

## 2017-11-27 DIAGNOSIS — J029 Acute pharyngitis, unspecified: Secondary | ICD-10-CM | POA: Diagnosis not present

## 2018-02-07 DIAGNOSIS — H8113 Benign paroxysmal vertigo, bilateral: Secondary | ICD-10-CM | POA: Diagnosis not present

## 2018-03-10 DIAGNOSIS — J0100 Acute maxillary sinusitis, unspecified: Secondary | ICD-10-CM | POA: Diagnosis not present

## 2018-03-16 DIAGNOSIS — J069 Acute upper respiratory infection, unspecified: Secondary | ICD-10-CM | POA: Diagnosis not present

## 2018-04-11 ENCOUNTER — Ambulatory Visit (INDEPENDENT_AMBULATORY_CARE_PROVIDER_SITE_OTHER): Payer: BLUE CROSS/BLUE SHIELD | Admitting: Emergency Medicine

## 2018-04-11 ENCOUNTER — Other Ambulatory Visit: Payer: Self-pay

## 2018-04-11 ENCOUNTER — Telehealth (INDEPENDENT_AMBULATORY_CARE_PROVIDER_SITE_OTHER): Payer: BLUE CROSS/BLUE SHIELD | Admitting: Family Medicine

## 2018-04-11 ENCOUNTER — Encounter: Payer: Self-pay | Admitting: Emergency Medicine

## 2018-04-11 VITALS — BP 90/58 | HR 89 | Temp 98.4°F | Resp 16 | Ht 60.25 in | Wt 126.6 lb

## 2018-04-11 DIAGNOSIS — J988 Other specified respiratory disorders: Secondary | ICD-10-CM

## 2018-04-11 DIAGNOSIS — R05 Cough: Secondary | ICD-10-CM | POA: Diagnosis not present

## 2018-04-11 DIAGNOSIS — J302 Other seasonal allergic rhinitis: Secondary | ICD-10-CM | POA: Diagnosis not present

## 2018-04-11 DIAGNOSIS — R062 Wheezing: Secondary | ICD-10-CM | POA: Diagnosis not present

## 2018-04-11 DIAGNOSIS — B9789 Other viral agents as the cause of diseases classified elsewhere: Secondary | ICD-10-CM

## 2018-04-11 DIAGNOSIS — R059 Cough, unspecified: Secondary | ICD-10-CM

## 2018-04-11 DIAGNOSIS — R0602 Shortness of breath: Secondary | ICD-10-CM | POA: Diagnosis not present

## 2018-04-11 MED ORDER — BENZONATATE 200 MG PO CAPS: 200.0000 mg | ORAL_CAPSULE | Freq: Two times a day (BID) | ORAL | 0 refills | Status: DC | PRN

## 2018-04-11 MED ORDER — PREDNISONE 20 MG PO TABS: 40.0000 mg | ORAL_TABLET | Freq: Every day | ORAL | 0 refills | Status: AC

## 2018-04-11 NOTE — Progress Notes (Signed)
   Virtual Visit via telephone Note  I connected with patient on 04/11/18 at 854 am by telephone and verified that I am speaking with the correct person using two identifiers. Casey Dennis is currently located at home and patient is currently with her during visit. The provider, Rutherford Guys, MD is located in their home at time of visit.  I discussed the limitations, risks, security and privacy concerns of performing an evaluation and management service by telephone and the availability of in person appointments. I also discussed with the patient that there may be a patient responsible charge related to this service. The patient expressed understanding and agreed to proceed.  CC: allergies  Telephone visit today for worsening allergies  HPI  Patient has known seasonal allergies Has been taking zyrtec-D and has noticed that as medication weans off she feels SOB, chest pressure, Denies any nasal congestion Has minimally productive cough, feels mucous is stuck Has no known asthma Reports she has been wheezing SOB started a week ago Denies any exertional shortness of breath Non smoker No fever or chills ?  Fall Risk  03/31/2017 07/29/2016 07/25/2016 07/07/2016 08/06/2015  Falls in the past year? No No No No No     Depression screen [facility] 2/9 03/31/2017 07/29/2016 07/25/2016  Decreased Interest 0 0 0  Down, Depressed, Hopeless 0 0 0  PHQ - 2 Score 0 0 0    No Known Allergies  Prior to Admission medications   Medication Sig Start Date End Date Taking? Authorizing Provider  cetirizine-pseudoephedrine (ZYRTEC-D) 5-120 MG tablet Take 1 tablet by mouth 2 (two) times daily.   Yes [provider]    Past Medical History:  Diagnosis Date  . Allergy   . Anemia   . Anxiety   . Depression   . Medical history non-contributory     Past Surgical History:  Procedure Laterality Date  . NO PAST SURGERIES      Social History   Tobacco Use  . Smoking status: Never Smoker  .  Smokeless tobacco: Never Used  Substance Use Topics  . Alcohol use: No    Family History  Problem Relation Age of Onset  . Cancer Father   . Diabetes Mother   . Mental illness Sister   . Mental illness Brother     ROS  Objective  Vitals as reported by the patient: none  There were no vitals filed for this visit.  ASSESSMENT and PLAN  1. SOB (shortness of breath) 2. Seasonal allergies  Patient does not have previous known asthma. Needs to be evaluated in person.    FOLLOW-UP: needs in person OV   The above assessment and management plan was discussed with the patient. The patient verbalized understanding of and has agreed to the management plan. Patient is aware to call the clinic if symptoms persist or worsen. Patient is aware when to return to the clinic for a follow-up visit. Patient educated on when it is appropriate to go to the emergency department.    I provided 10 minutes of non-face-to-face time during this encounter.  Rutherford Guys, MD [facility] at [facility] [place], [place] [address] Ph.  [phone] Fax [phone]

## 2018-04-11 NOTE — Progress Notes (Signed)
Casey Dennis 28 y.o.   Chief Complaint  Patient presents with  . Cough    X 1 week with wheezing  . Shortness of Breath    HISTORY OF PRESENT ILLNESS: This is a 28 y.o. female complaining of dry cough for [redacted] week along with intermittent wheezing and shortness of breath.  Has been taking Zyrtec-D but when it wears off symptoms come back.  Denies fever or chills.  Denies recent traveling or coronavirus exposure.  No other significant symptoms.  HPI   Prior to Admission medications   Medication Sig Start Date End Date Taking? Authorizing Provider  cetirizine-pseudoephedrine (ZYRTEC-D) 5-120 MG tablet Take 1 tablet by mouth 2 (two) times daily.   Yes [provider]    No Known Allergies  Patient Active Problem List   Diagnosis Date Noted  . Bipolar disorder, unspecified ([place]) 08/25/2017    Past Medical History:  Diagnosis Date  . Allergy   . Anemia   . Anxiety   . Depression   . Medical history non-contributory     Past Surgical History:  Procedure Laterality Date  . NO PAST SURGERIES      Social History   Socioeconomic History  . Marital status: Legally Separated    Spouse name: Not on file  . Number of children: 1  . Years of education: Not on file  . Highest education level: Not on file  Occupational History  . Not on file  Social Needs  . Financial resource strain: Not on file  . Food insecurity:    Worry: Patient refused    Inability: Never true  . Transportation needs:    Medical: No    Non-medical: No  Tobacco Use  . Smoking status: Never Smoker  . Smokeless tobacco: Never Used  Substance and Sexual Activity  . Alcohol use: No  . Drug use: No  . Sexual activity: Yes    Birth control/protection: None  Lifestyle  . Physical activity:    Days per week: Not on file    Minutes per session: Not on file  . Stress: Not on file  Relationships  . Social connections:    Talks on phone: Not on file    Gets together: Not on file    Attends  religious service: Not on file    Active member of club or organization: Not on file    Attends meetings of clubs or organizations: Not on file    Relationship status: Not on file  . Intimate partner violence:    Fear of current or ex partner: Not on file    Emotionally abused: Not on file    Physically abused: Not on file    Forced sexual activity: Not on file  Other Topics Concern  . Not on file  Social History Narrative  . Not on file    Family History  Problem Relation Age of Onset  . Cancer Father   . Diabetes Mother   . Mental illness Sister   . Mental illness Brother      Review of Systems  Constitutional: Negative.  Negative for chills and fever.  HENT: Negative.  Negative for sore throat.   Eyes: Negative.   Respiratory: Positive for cough, shortness of breath and wheezing. Negative for hemoptysis and sputum production.   Cardiovascular: Negative.  Negative for chest pain and palpitations.  Gastrointestinal: Negative.  Negative for abdominal pain, nausea and vomiting.  Genitourinary: Negative.   Musculoskeletal: Negative.   Skin: Negative.  Negative  for rash.  Neurological: Negative.  Negative for headaches.  Endo/Heme/Allergies: Negative.   All other systems reviewed and are negative.  Vitals:   04/11/18 1501  BP: (!) 90/58  Pulse: 89  Resp: 16  Temp: 98.4 F (36.9 C)  SpO2: 98%     Physical Exam Vitals signs reviewed.  Constitutional:      General: She is not in acute distress.    Appearance: Normal appearance. She is well-developed. She is not ill-appearing or toxic-appearing.  HENT:     Head: Normocephalic and atraumatic.     Nose: Nose normal.     Mouth/Throat:     Mouth: Mucous membranes are moist.     Pharynx: Oropharynx is clear.  Eyes:     Extraocular Movements: Extraocular movements intact.     Conjunctiva/sclera: Conjunctivae normal.     Pupils: Pupils are equal, round, and reactive to light.  Neck:     Musculoskeletal: Normal range  of motion and neck supple.  Cardiovascular:     Rate and Rhythm: Normal rate and regular rhythm.     Heart sounds: Normal heart sounds.  Pulmonary:     Effort: Pulmonary effort is normal.     Breath sounds: Normal breath sounds.  Musculoskeletal: Normal range of motion.  Lymphadenopathy:     Cervical: No cervical adenopathy.  Skin:    General: Skin is warm and dry.     Capillary Refill: Capillary refill takes less than 2 seconds.  Neurological:     General: No focal deficit present.     Mental Status: She is alert and oriented to person, place, and time.  Psychiatric:        Mood and Affect: Mood normal.        Behavior: Behavior normal.    A total of 25 minutes was spent in the room with the patient, greater than 50% of which was in counseling/coordination of care regarding differential diagnosis, treatment, medication, need to stay home for 2 weeks, prognosis, coronavirus precautions, and follow-up if no better or worse.   ASSESSMENT & PLAN: Adriona was seen today for cough and shortness of breath.  Diagnoses and all orders for this visit:  Cough -     benzonatate (TESSALON) 200 MG capsule; Take 1 capsule (200 mg total) by mouth 2 (two) times daily as needed for cough.  Viral respiratory infection  Wheezing -     predniSONE (DELTASONE) 20 MG tablet; Take 2 tablets (40 mg total) by mouth daily with breakfast for 5 days.    Patient Instructions       If you have lab work done today you will be contacted with your lab results within the next 2 weeks.  If you have not heard from [place] then please contact us. The fastest way to get your results is to register for My Chart.   IF you received an x-ray today, you will receive an invoice from [facility]. Please contact [facility] at [phone] with questions or concerns regarding your invoice.   IF you received labwork today, you will receive an invoice from [facility]. Please contact [facility] at  [phone] with questions or concerns regarding your invoice.   Our billing staff will not be able to assist you with questions regarding bills from these companies.  You will be contacted with the lab results as soon as they are available. The fastest way to get your results is to activate your My Chart account. Instructions are located on the last page of  this paperwork. If you have not heard from [place] regarding the results in 2 weeks, please contact this office.     Viral Respiratory Infection A viral respiratory infection is an illness that affects parts of the body that are used for breathing. These include the lungs, nose, and throat. It is caused by a germ called a virus. Some examples of this kind of infection are:  A cold.  The flu (influenza).  A respiratory syncytial virus (RSV) infection. A person who gets this illness may have the following symptoms:  A stuffy or runny nose.  Yellow or green fluid in the nose.  A cough.  Sneezing.  Tiredness (fatigue).  Achy muscles.  A sore throat.  Sweating or chills.  A fever.  A headache. Follow these instructions at home: Managing pain and congestion  Take over-the-counter and prescription medicines only as told by your doctor.  If you have a sore throat, gargle with salt water. Do this 3-4 times per day or as needed. To make a salt-water mixture, dissolve -1 tsp of salt in 1 cup of warm water. Make sure that all the salt dissolves.  Use nose drops made from salt water. This helps with stuffiness (congestion). It also helps soften the skin around your nose.  Drink enough fluid to keep your pee (urine) pale yellow. General instructions   Rest as much as possible.  Do not drink alcohol.  Do not use any products that have nicotine or tobacco, such as cigarettes and e-cigarettes. If you need help quitting, ask your doctor.  Keep all follow-up visits as told by your doctor. This is important. How is this  prevented?   Get a flu shot every year. Ask your doctor when you should get your flu shot.  Do not let other people get your germs. If you are sick: ? Stay home from work or school. ? Wash your hands with soap and water often. Wash your hands after you cough or sneeze. If soap and water are not available, use hand sanitizer.  Avoid contact with people who are sick during cold and flu season. This is in fall and winter. Get help if:  Your symptoms last for 10 days or longer.  Your symptoms get worse over time.  You have a fever.  You have very bad pain in your face or forehead.  Parts of your jaw or neck become very swollen. Get help right away if:  You feel pain or pressure in your chest.  You have shortness of breath.  You faint or feel like you will faint.  You keep throwing up (vomiting).  You feel confused. Summary  A viral respiratory infection is an illness that affects parts of the body that are used for breathing.  Examples of this illness include a cold, the flu, and respiratory syncytial virus (RSV) infection.  The infection can cause a runny nose, cough, sneezing, sore throat, and fever.  Follow what your doctor tells you about taking medicines, drinking lots of fluid, washing your hands, resting at home, and avoiding people who are sick. This information is not intended to replace advice given to you by your health care provider. Make sure you discuss any questions you have with your health care provider. Document Released: 12/17/2007 Document Revised: 02/13/2017 Document Reviewed: 02/13/2017 Elsevier Interactive Patient Education  2019 Elsevier Inc.       Agustina Caroli, MD [facility]

## 2018-04-11 NOTE — Patient Instructions (Addendum)
If you have lab work done today you will be contacted with your lab results within the next 2 weeks.  If you have not heard from [place] then please contact us. The fastest way to get your results is to register for My Chart.   IF you received an x-ray today, you will receive an invoice from [facility]. Please contact [facility] at [phone] with questions or concerns regarding your invoice.   IF you received labwork today, you will receive an invoice from [facility]. Please contact [facility] at [phone] with questions or concerns regarding your invoice.   Our billing staff will not be able to assist you with questions regarding bills from these companies.  You will be contacted with the lab results as soon as they are available. The fastest way to get your results is to activate your My Chart account. Instructions are located on the last page of this paperwork. If you have not heard from [place] regarding the results in 2 weeks, please contact this office.     Viral Respiratory Infection A viral respiratory infection is an illness that affects parts of the body that are used for breathing. These include the lungs, nose, and throat. It is caused by a germ called a virus. Some examples of this kind of infection are:  A cold.  The flu (influenza).  A respiratory syncytial virus (RSV) infection. A person who gets this illness may have the following symptoms:  A stuffy or runny nose.  Yellow or green fluid in the nose.  A cough.  Sneezing.  Tiredness (fatigue).  Achy muscles.  A sore throat.  Sweating or chills.  A fever.  A headache. Follow these instructions at home: Managing pain and congestion  Take over-the-counter and prescription medicines only as told by your doctor.  If you have a sore throat, gargle with salt water. Do this 3-4 times per day or as needed. To make a salt-water mixture, dissolve -1 tsp of salt in 1 cup of warm water. Make  sure that all the salt dissolves.  Use nose drops made from salt water. This helps with stuffiness (congestion). It also helps soften the skin around your nose.  Drink enough fluid to keep your pee (urine) pale yellow. General instructions   Rest as much as possible.  Do not drink alcohol.  Do not use any products that have nicotine or tobacco, such as cigarettes and e-cigarettes. If you need help quitting, ask your doctor.  Keep all follow-up visits as told by your doctor. This is important. How is this prevented?   Get a flu shot every year. Ask your doctor when you should get your flu shot.  Do not let other people get your germs. If you are sick: ? Stay home from work or school. ? Wash your hands with soap and water often. Wash your hands after you cough or sneeze. If soap and water are not available, use hand sanitizer.  Avoid contact with people who are sick during cold and flu season. This is in fall and winter. Get help if:  Your symptoms last for 10 days or longer.  Your symptoms get worse over time.  You have a fever.  You have very bad pain in your face or forehead.  Parts of your jaw or neck become very swollen. Get help right away if:  You feel pain or pressure in your chest.  You have shortness of breath.  You faint or feel like you  will faint.  You keep throwing up (vomiting).  You feel confused. Summary  A viral respiratory infection is an illness that affects parts of the body that are used for breathing.  Examples of this illness include a cold, the flu, and respiratory syncytial virus (RSV) infection.  The infection can cause a runny nose, cough, sneezing, sore throat, and fever.  Follow what your doctor tells you about taking medicines, drinking lots of fluid, washing your hands, resting at home, and avoiding people who are sick. This information is not intended to replace advice given to you by your health care provider. Make sure you  discuss any questions you have with your health care provider. Document Released: 12/17/2007 Document Revised: 02/13/2017 Document Reviewed: 02/13/2017 Elsevier Interactive Patient Education  2019 [employer].

## 2018-11-10 DIAGNOSIS — N390 Urinary tract infection, site not specified: Secondary | ICD-10-CM | POA: Diagnosis not present

## 2018-11-10 DIAGNOSIS — M67833 Other specified disorders of tendon, right wrist: Secondary | ICD-10-CM | POA: Diagnosis not present

## 2018-11-19 DIAGNOSIS — M25531 Pain in right wrist: Secondary | ICD-10-CM | POA: Diagnosis not present

## 2019-01-16 DIAGNOSIS — Z20828 Contact with and (suspected) exposure to other viral communicable diseases: Secondary | ICD-10-CM | POA: Diagnosis not present

## 2019-03-04 ENCOUNTER — Encounter: Payer: Self-pay | Admitting: Registered Nurse

## 2019-03-04 ENCOUNTER — Other Ambulatory Visit: Payer: Self-pay

## 2019-03-04 ENCOUNTER — Ambulatory Visit (INDEPENDENT_AMBULATORY_CARE_PROVIDER_SITE_OTHER): Payer: BC Managed Care – PPO | Admitting: Registered Nurse

## 2019-03-04 VITALS — BP 112/74 | HR 70 | Temp 97.7°F | Resp 16 | Ht 60.25 in | Wt 129.0 lb

## 2019-03-04 DIAGNOSIS — D170 Benign lipomatous neoplasm of skin and subcutaneous tissue of head, face and neck: Secondary | ICD-10-CM | POA: Diagnosis not present

## 2019-03-04 DIAGNOSIS — Z13228 Encounter for screening for other metabolic disorders: Secondary | ICD-10-CM | POA: Diagnosis not present

## 2019-03-04 DIAGNOSIS — Z13220 Encounter for screening for lipoid disorders: Secondary | ICD-10-CM | POA: Diagnosis not present

## 2019-03-04 DIAGNOSIS — Z130 Encounter for screening for diseases of the blood and blood-forming organs and certain disorders involving the immune mechanism: Secondary | ICD-10-CM | POA: Diagnosis not present

## 2019-03-04 DIAGNOSIS — F4329 Adjustment disorder with other symptoms: Secondary | ICD-10-CM

## 2019-03-04 DIAGNOSIS — Z1329 Encounter for screening for other suspected endocrine disorder: Secondary | ICD-10-CM

## 2019-03-04 DIAGNOSIS — F4321 Adjustment disorder with depressed mood: Secondary | ICD-10-CM

## 2019-03-04 MED ORDER — FLUOXETINE HCL 20 MG PO TABS: 20.0000 mg | ORAL_TABLET | Freq: Every day | ORAL | 0 refills | Status: DC

## 2019-03-04 NOTE — Progress Notes (Signed)
Established Patient Office Visit  Subjective:  Patient ID: Casey Dennis, female    DOB: 12-10-1990  Age: 29 y.o. MRN: [id number]  CC:  Chief Complaint  Patient presents with  . New Patient (Initial Visit)    establish care. Urine collected on pt. Pt has lump on head left side x weeks.  Pt concerned as father had a hemotoma that had to be removed.  No pain but notices it and less comfortable on left side due to lump.  Marland Kitchen GAD 7    score:7  . Depression    score: 7    HPI Casey Dennis presents for visit to establish care.  C/o bump on head. On posterior of head above and behind ear on L side. Noticed recently, does not know how long it has been present. It is not tender unless she applies firm pressure. No drainage. No recent URI or other symptoms. This has not happened before. No lymphadenopathy through head or neck.  Also scores 7 on both PHQ and GAD - states that her dad passed away in Jan 20, 2023 and her grandfather is gravely ill. This has been a lot for her to take in. In the past, she was treated for bipolar and did not like the way the medications made her feel - though she is willing to try something again. Declines counseling. 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 Married, 1 daughter who is 66.71 years old.  Past Medical History:  Diagnosis Date  . Allergy   . Anemia   . Anxiety   . Depression   . Medical history non-contributory     Past Surgical History:  Procedure Laterality Date  . NO PAST SURGERIES    . TOOTH EXTRACTION      Family History  Problem Relation Age of Onset  . Cancer Father   . Diabetes Mother   . Mental illness Sister   . Mental illness Brother     Social History   Socioeconomic History  . Marital status: Legally Separated    Spouse name: Not on file  . Number of children: 1  . Years of education: Not on file  . Highest education level: Not on file  Occupational History  . Not on file  Tobacco Use  . Smoking  status: Never Smoker  . Smokeless tobacco: Never Used  Substance and Sexual Activity  . Alcohol use: No  . Drug use: No  . Sexual activity: Yes    Birth control/protection: None  Other Topics Concern  . Not on file  Social History Narrative  . Not on file   Social Determinants of Health   Financial Resource Strain:   . Difficulty of Paying Living Expenses: Not on file  Food Insecurity:   . Worried About Charity fundraiser in the Last Year: Not on file  . Ran Out of Food in the Last Year: Not on file  Transportation Needs:   . Lack of Transportation (Medical): Not on file  . Lack of Transportation (Non-Medical): Not on file  Physical Activity:   . Days of Exercise per Week: Not on file  . Minutes of Exercise per Session: Not on file  Stress:   . Feeling of Stress : Not on file  Social Connections:   . Frequency of Communication with Friends and Family: Not on file  . Frequency of Social Gatherings with Friends and Family: Not on file  . Attends Religious Services: Not on file  .  Active Member of Clubs or Organizations: Not on file  . Attends Archivist Meetings: Not on file  . Marital Status: Not on file  Intimate Partner Violence:   . Fear of Current or Ex-Partner: Not on file  . Emotionally Abused: Not on file  . Physically Abused: Not on file  . Sexually Abused: Not on file    Outpatient Medications Prior to Visit  Medication Sig Dispense Refill  . cetirizine-pseudoephedrine (ZYRTEC-D) 5-120 MG tablet Take 1 tablet by mouth 2 (two) times daily.    . benzonatate (TESSALON) 200 MG capsule Take 1 capsule (200 mg total) by mouth 2 (two) times daily as needed for cough. (Patient not taking: Reported on 03/04/2019) 20 capsule 0   No facility-administered medications prior to visit.    No Known Allergies  ROS Review of Systems  Constitutional: Negative.   HENT: Negative.   Eyes: Negative.   Respiratory: Negative.   Cardiovascular: Negative.     Gastrointestinal: Negative.   Endocrine: Negative.   Genitourinary: Negative.   Musculoskeletal: Negative.   Skin: Negative for color change, pallor, rash and wound.  Allergic/Immunologic: Negative.   Neurological: Negative.   Hematological: Negative.   Psychiatric/Behavioral: Positive for dysphoric mood. Negative for agitation, behavioral problems, confusion, decreased concentration, hallucinations, self-injury, sleep disturbance and suicidal ideas. The patient is nervous/anxious. The patient is not hyperactive.   All other systems reviewed and are negative.     Objective:    Physical Exam  Constitutional: She is oriented to person, place, and time. She appears well-developed and well-nourished. No distress.  Cardiovascular: Normal rate, regular rhythm and normal heart sounds. Exam reveals no gallop and no friction rub.  No murmur heard. Pulmonary/Chest: Effort normal and breath sounds normal. No respiratory distress. She has no wheezes. She has no rales. She exhibits no tenderness.  Neurological: She is alert and oriented to person, place, and time. No cranial nerve deficit.  Skin: Skin is warm and dry. No rash noted. She is not diaphoretic. No erythema. No pallor.     Psychiatric: She has a normal mood and affect. Her behavior is normal. Judgment and thought content normal.  Nursing note and vitals reviewed.   BP 112/74 (BP Location: Left Arm, Patient Position: Sitting, Cuff Size: Normal)   Pulse 70   Temp 97.7 F (36.5 C) (Oral)   Resp 16   Ht 5' 0.25" (1.53 m)   Wt 129 lb (58.5 kg)   LMP 02/12/2019   SpO2 99%   BMI 24.98 kg/m  Wt Readings from Last 3 Encounters:  03/04/19 129 lb (58.5 kg)  04/11/18 126 lb 9.6 oz (57.4 kg)  03/31/17 130 lb (59 kg)     There are no preventive care reminders to display for this patient.  There are no preventive care reminders to display for this patient.  Lab Results  Component Value Date   TSH 3.407 08/26/2017   Lab Results   Component Value Date   WBC 10.9 (H) 08/24/2017   HGB 12.2 08/24/2017   HCT 38.8 08/24/2017   MCV 81.9 08/24/2017   PLT 271 08/24/2017   Lab Results  Component Value Date   NA 141 08/24/2017   K 4.2 08/24/2017   CO2 24 08/24/2017   GLUCOSE 101 (H) 08/24/2017   BUN 10 08/24/2017   CREATININE 0.83 08/24/2017   BILITOT 0.8 08/24/2017   ALKPHOS 51 08/24/2017   AST 17 08/24/2017   ALT 17 08/24/2017   PROT 8.3 (H) 08/24/2017  ALBUMIN 4.5 08/24/2017   CALCIUM 9.8 08/24/2017   ANIONGAP 10 08/24/2017   No results found for: CHOL No results found for: HDL No results found for: LDLCALC No results found for: TRIG No results found for: CHOLHDL No results found for: HGBA1C    Assessment & Plan:   Problem List Items Addressed This Visit    None    Visit Diagnoses    Complicated grief    -  Primary   Relevant Medications   FLUoxetine (PROZAC) 20 MG tablet   Screening for endocrine, metabolic and immunity disorder       Relevant Orders   TSH   CBC   Basic Metabolic Panel   Lipid screening       Relevant Orders   Lipid Panel   Lipoma of head       Relevant Orders   Ambulatory referral to Dermatology      Meds ordered this encounter  Medications  . FLUoxetine (PROZAC) 20 MG tablet    Sig: Take 1 tablet (20 mg total) by mouth daily.    Dispense:  90 tablet    Refill:  0    Order Specific Question:   Supervising Provider    Answer:   Forrest Moron [id number]    Follow-up: Return in about 6 weeks (around 04/15/2019) for 5-6 weeks - Telemed - Fluoxetine med check.   PLAN  Start fluoxetine 20mg  PO qd - med check in 5-6 weeks  Lesion on head likely benign - will refer to derm for further assessment. Reassured patient that this did not give many red flag symptoms to me at this time.  Labs drawn, will follow up as warranted  Patient encouraged to call clinic with any questions, comments, or concerns.  Maximiano Coss, NP

## 2019-03-05 ENCOUNTER — Encounter: Payer: Self-pay | Admitting: Radiology

## 2019-03-05 ENCOUNTER — Ambulatory Visit: Payer: BLUE CROSS/BLUE SHIELD | Admitting: Registered Nurse

## 2019-03-05 LAB — LIPID PANEL
Chol/HDL Ratio: 2.2 ratio (ref 0.0–4.4)
Cholesterol, Total: 188 mg/dL (ref 100–199)
HDL: 87 mg/dL (ref >39)
LDL Chol Calc (NIH): 89 mg/dL (ref 0–99)
Triglycerides: 64 mg/dL (ref 0–149)
VLDL Cholesterol Cal: 12 mg/dL (ref 5–40)

## 2019-03-05 LAB — BASIC METABOLIC PANEL
BUN/Creatinine Ratio: 23 (ref 9–23)
BUN: 16 mg/dL (ref 6–20)
CO2: 22 mmol/L (ref 20–29)
Calcium: 10.3 mg/dL — ABNORMAL HIGH (ref 8.7–10.2)
Chloride: 102 mmol/L (ref 96–106)
Creatinine, Ser: 0.71 mg/dL (ref 0.57–1.00)
GFR calc Af Amer: 133 mL/min/{1.73_m2} (ref >59)
GFR calc non Af Amer: 115 mL/min/{1.73_m2} (ref >59)
Glucose: 86 mg/dL (ref 65–99)
Potassium: 4.5 mmol/L (ref 3.5–5.2)
Sodium: 139 mmol/L (ref 134–144)

## 2019-03-05 LAB — CBC
Hematocrit: 39.5 % (ref 34.0–46.6)
Hemoglobin: 12.9 g/dL (ref 11.1–15.9)
MCH: 26.6 pg (ref 26.6–33.0)
MCHC: 32.7 g/dL (ref 31.5–35.7)
MCV: 81 fL (ref 79–97)
Platelets: 287 10*3/uL (ref 150–450)
RBC: 4.85 x10E6/uL (ref 3.77–5.28)
RDW: 12.6 % (ref 11.7–15.4)
WBC: 7.2 10*3/uL (ref 3.4–10.8)

## 2019-03-05 LAB — TSH: TSH: 1.18 u[IU]/mL (ref 0.450–4.500)

## 2019-03-05 NOTE — Progress Notes (Signed)
Good morning  Normal results letter, please  Thank you  Rich Rubina Basinski, NP

## 2019-03-06 ENCOUNTER — Encounter: Payer: Self-pay | Admitting: Registered Nurse

## 2019-03-07 NOTE — Telephone Encounter (Signed)
See mychart note.  Pt is describing prozac 20 mg is okay but she feels numb to life and in the past she has felt like a zombie on meds before and this one is better but if it was in a lower dose she thinks this would be perfect. Pt wants to know if it is in a lower dosage for future reference.  Please advise.

## 2019-03-08 ENCOUNTER — Other Ambulatory Visit: Payer: Self-pay | Admitting: Registered Nurse

## 2019-03-08 DIAGNOSIS — F4321 Adjustment disorder with depressed mood: Secondary | ICD-10-CM

## 2019-03-08 DIAGNOSIS — F4329 Adjustment disorder with other symptoms: Secondary | ICD-10-CM

## 2019-03-08 DIAGNOSIS — F3175 Bipolar disorder, in partial remission, most recent episode depressed: Secondary | ICD-10-CM

## 2019-03-08 MED ORDER — FLUOXETINE HCL 10 MG PO CAPS: 10.0000 mg | ORAL_CAPSULE | Freq: Every day | ORAL | 0 refills | Status: DC

## 2019-03-21 ENCOUNTER — Encounter: Payer: Self-pay | Admitting: Registered Nurse

## 2019-03-22 ENCOUNTER — Other Ambulatory Visit: Payer: Self-pay | Admitting: Registered Nurse

## 2019-03-22 DIAGNOSIS — F5104 Psychophysiologic insomnia: Secondary | ICD-10-CM

## 2019-03-22 MED ORDER — TRAZODONE HCL 50 MG PO TABS: 25.0000 mg | ORAL_TABLET | Freq: Every evening | ORAL | 0 refills | Status: DC | PRN

## 2019-03-25 ENCOUNTER — Ambulatory Visit: Payer: BC Managed Care – PPO | Admitting: Registered Nurse

## 2019-04-01 ENCOUNTER — Ambulatory Visit: Payer: BC Managed Care – PPO | Admitting: Registered Nurse

## 2019-04-01 NOTE — Telephone Encounter (Signed)
Attempted to call patient , but will discuss medication today at her appointment.

## 2019-04-08 ENCOUNTER — Other Ambulatory Visit: Payer: Self-pay

## 2019-04-08 ENCOUNTER — Telehealth (INDEPENDENT_AMBULATORY_CARE_PROVIDER_SITE_OTHER): Payer: BC Managed Care – PPO | Admitting: Registered Nurse

## 2019-04-08 ENCOUNTER — Encounter: Payer: Self-pay | Admitting: Registered Nurse

## 2019-04-08 DIAGNOSIS — F3175 Bipolar disorder, in partial remission, most recent episode depressed: Secondary | ICD-10-CM | POA: Diagnosis not present

## 2019-04-08 DIAGNOSIS — F4321 Adjustment disorder with depressed mood: Secondary | ICD-10-CM

## 2019-04-08 DIAGNOSIS — F4329 Adjustment disorder with other symptoms: Secondary | ICD-10-CM | POA: Diagnosis not present

## 2019-04-08 MED ORDER — FLUOXETINE HCL 20 MG PO TABS: 20.0000 mg | ORAL_TABLET | Freq: Every day | ORAL | 3 refills | Status: DC

## 2019-04-08 NOTE — Progress Notes (Signed)
Telemedicine Encounter- SOAP NOTE Established Patient  This telephone encounter was conducted with the patient's (or proxy's) verbal consent via audio telecommunications: yes  Patient was instructed to have this encounter in a suitably private space; and to only have persons present to whom they give permission to participate. In addition, patient identity was confirmed by use of name plus two identifiers (DOB and address).  I discussed the limitations, risks, security and privacy concerns of performing an evaluation and management service by telephone and the availability of in person appointments. I also discussed with the patient that there may be a patient responsible charge related to this service. The patient expressed understanding and agreed to proceed.  I spent a total of 11 minutes talking with the patient or their proxy.  Chief Complaint  Patient presents with  . Follow-up    Follow up on medication check for Fluoxentine patient feels like she is doing verry well    Subjective   Casey Dennis is a 29 y.o. established patient. Video visit today for fluoxetine med check  HPI Taking 20mg  PO qd.  States that it initially interrupted sleep but as we discussed over MyChart, has been taking melatonin prn with good effect Feeling less feelings of depression and anxiety, coping better. Denies hi/si/self harm  Overall happy with medication, hopes to continue.   Patient Active Problem List   Diagnosis Date Noted  . Complicated grief [id number]  . Bipolar disorder, unspecified ([place]) 08/25/2017    Past Medical History:  Diagnosis Date  . Allergy   . Anemia   . Anxiety   . Depression   . Medical history non-contributory     Current Outpatient Medications  Medication Sig Dispense Refill  . cetirizine-pseudoephedrine (ZYRTEC-D) 5-120 MG tablet Take 1 tablet by mouth 2 (two) times daily.    . benzonatate (TESSALON) 200 MG capsule Take 1 capsule (200 mg total) by mouth 2 (two)  times daily as needed for cough. (Patient not taking: Reported on 03/04/2019) 20 capsule 0  . FLUoxetine (PROZAC) 20 MG tablet Take 1 tablet (20 mg total) by mouth daily. 90 tablet 3   No current facility-administered medications for this visit.    No Known Allergies  Social History   Socioeconomic History  . Marital status: Married    Spouse name: Not on file  . Number of children: 1  . Years of education: Not on file  . Highest education level: Not on file  Occupational History  . Not on file  Tobacco Use  . Smoking status: Never Smoker  . Smokeless tobacco: Never Used  Substance and Sexual Activity  . Alcohol use: No  . Drug use: No  . Sexual activity: Yes    Birth control/protection: None  Other Topics Concern  . Not on file  Social History Narrative  . Not on file   Social Determinants of Health   Financial Resource Strain:   . Difficulty of Paying Living Expenses:   Food Insecurity:   . Worried About Charity fundraiser in the Last Year:   . Arboriculturist in the Last Year:   Transportation Needs:   . Film/video editor (Medical):   Marland Kitchen Lack of Transportation (Non-Medical):   Physical Activity:   . Days of Exercise per Week:   . Minutes of Exercise per Session:   Stress:   . Feeling of Stress :   Social Connections:   . Frequency of Communication with Friends and Family:   .  Frequency of Social Gatherings with Friends and Family:   . Attends Religious Services:   . Active Member of Clubs or Organizations:   . Attends Archivist Meetings:   Marland Kitchen Marital Status:   Intimate Partner Violence:   . Fear of Current or Ex-Partner:   . Emotionally Abused:   Marland Kitchen Physically Abused:   . Sexually Abused:     Review of Systems  Constitutional: Negative.   HENT: Negative.   Eyes: Negative.   Respiratory: Negative.   Cardiovascular: Negative.   Gastrointestinal: Negative.   Genitourinary: Negative.   Musculoskeletal: Negative.   Skin: Negative.     Neurological: Negative.   Endo/Heme/Allergies: Negative.   Psychiatric/Behavioral: Negative.   All other systems reviewed and are negative.   Objective   Vitals as reported by the patient: There were no vitals filed for this visit.  Casey Dennis was seen today for follow-up.  Diagnoses and all orders for this visit:  Complicated grief -     FLUoxetine (PROZAC) 20 MG tablet; Take 1 tablet (20 mg total) by mouth daily.  Bipolar disorder, in partial remission, most recent episode depressed ([facility]) -     FLUoxetine (PROZAC) 20 MG tablet; Take 1 tablet (20 mg total) by mouth daily.   PLAN  Continue fluoxetine daily.   Call with concerns  Med check in 1 year - CPE and labs at that time ideally.  Patient encouraged to call clinic with any questions, comments, or concerns.   I discussed the assessment and treatment plan with the patient. The patient was provided an opportunity to ask questions and all were answered. The patient agreed with the plan and demonstrated an understanding of the instructions.   The patient was advised to call back or seek an in-person evaluation if the symptoms worsen or if the condition fails to improve as anticipated.  I provided 11 minutes of non-face-to-face time during this encounter.  Maximiano Coss, NP  Primary Care at [facility]

## 2019-04-08 NOTE — Patient Instructions (Signed)
° ° ° °  If you have lab work done today you will be contacted with your lab results within the next 2 weeks.  If you have not heard from us then please contact us. The fastest way to get your results is to register for My Chart. ° ° °IF you received an x-ray today, you will receive an invoice from [facility]. Please contact [facility] at [phone] with questions or concerns regarding your invoice.  ° °IF you received labwork today, you will receive an invoice from [facility]. Please contact [facility] at [phone] with questions or concerns regarding your invoice.  ° °Our billing staff will not be able to assist you with questions regarding bills from these companies. ° °You will be contacted with the lab results as soon as they are available. The fastest way to get your results is to activate your My Chart account. Instructions are located on the last page of this paperwork. If you have not heard from us regarding the results in 2 weeks, please contact this office. °  ° ° ° °

## 2019-08-20 ENCOUNTER — Encounter: Payer: Self-pay | Admitting: Registered Nurse

## 2019-08-21 ENCOUNTER — Ambulatory Visit: Payer: BC Managed Care – PPO | Admitting: Registered Nurse

## 2019-09-09 ENCOUNTER — Ambulatory Visit: Payer: BC Managed Care – PPO | Admitting: Family Medicine

## 2019-09-16 ENCOUNTER — Other Ambulatory Visit: Payer: Self-pay

## 2019-09-16 ENCOUNTER — Encounter: Payer: Self-pay | Admitting: Registered Nurse

## 2019-09-16 ENCOUNTER — Ambulatory Visit (INDEPENDENT_AMBULATORY_CARE_PROVIDER_SITE_OTHER): Payer: BC Managed Care – PPO | Admitting: Registered Nurse

## 2019-09-16 VITALS — BP 113/73 | HR 71 | Temp 98.2°F | Ht 61.0 in | Wt 132.0 lb

## 2019-09-16 DIAGNOSIS — Z3009 Encounter for other general counseling and advice on contraception: Secondary | ICD-10-CM | POA: Diagnosis not present

## 2019-09-16 LAB — POCT URINE PREGNANCY: Preg Test, Ur: NEGATIVE

## 2019-09-16 MED ORDER — LEVONORGESTREL-ETHINYL ESTRAD 0.1-20 MG-MCG PO TABS: 1.0000 | ORAL_TABLET | Freq: Every day | ORAL | 4 refills | Status: DC

## 2019-09-16 NOTE — Progress Notes (Signed)
Established Patient Office Visit  Subjective:  Patient ID: Malayzia Laforte, female    DOB: 12-24-1990  Age: 29 y.o. MRN: [id number]  CC:  Chief Complaint  Patient presents with  . Contraception    wants to get the pill     HPI Nykole Badman presents for birth control start  Has been on COCs before. Wants to resume  Does report unprotected intercourse within last 7 days. Understands that she will need to wait for next normal menses to start COCs Will use condoms or abstain until then No history of CA or dvts No other preclusions to COCs.  Past Medical History:  Diagnosis Date  . Allergy   . Anemia   . Anxiety   . Depression   . Medical history non-contributory     Past Surgical History:  Procedure Laterality Date  . NO PAST SURGERIES    . TOOTH EXTRACTION      Family History  Problem Relation Age of Onset  . Cancer Father   . Diabetes Mother   . Mental illness Sister   . Mental illness Brother     Social History   Socioeconomic History  . Marital status: Married    Spouse name: Not on file  . Number of children: 1  . Years of education: Not on file  . Highest education level: Not on file  Occupational History  . Not on file  Tobacco Use  . Smoking status: Never Smoker  . Smokeless tobacco: Never Used  Vaping Use  . Vaping Use: Never used  Substance and Sexual Activity  . Alcohol use: No  . Drug use: No  . Sexual activity: Yes    Birth control/protection: None  Other Topics Concern  . Not on file  Social History Narrative  . Not on file   Social Determinants of Health   Financial Resource Strain:   . Difficulty of Paying Living Expenses: Not on file  Food Insecurity:   . Worried About Charity fundraiser in the Last Year: Not on file  . Ran Out of Food in the Last Year: Not on file  Transportation Needs:   . Lack of Transportation (Medical): Not on file  . Lack of Transportation (Non-Medical): Not on file  Physical Activity:   . Days of  Exercise per Week: Not on file  . Minutes of Exercise per Session: Not on file  Stress:   . Feeling of Stress : Not on file  Social Connections:   . Frequency of Communication with Friends and Family: Not on file  . Frequency of Social Gatherings with Friends and Family: Not on file  . Attends Religious Services: Not on file  . Active Member of Clubs or Organizations: Not on file  . Attends Archivist Meetings: Not on file  . Marital Status: Not on file  Intimate Partner Violence:   . Fear of Current or Ex-Partner: Not on file  . Emotionally Abused: Not on file  . Physically Abused: Not on file  . Sexually Abused: Not on file    Outpatient Medications Prior to Visit  Medication Sig Dispense Refill  . FLUoxetine (PROZAC) 20 MG tablet Take 1 tablet (20 mg total) by mouth daily. 90 tablet 3  . benzonatate (TESSALON) 200 MG capsule Take 1 capsule (200 mg total) by mouth 2 (two) times daily as needed for cough. (Patient not taking: Reported on 03/04/2019) 20 capsule 0  . cetirizine-pseudoephedrine (ZYRTEC-D) 5-120 MG tablet Take 1 tablet by  mouth 2 (two) times daily.     No facility-administered medications prior to visit.    No Known Allergies  ROS Review of Systems  Constitutional: Negative.   HENT: Negative.   Eyes: Negative.   Respiratory: Negative.   Cardiovascular: Negative.   Gastrointestinal: Negative.   Endocrine: Negative.   Genitourinary: Negative.   Musculoskeletal: Negative.   Skin: Negative.   Allergic/Immunologic: Negative.   Neurological: Negative.   Hematological: Negative.   Psychiatric/Behavioral: Negative.   All other systems reviewed and are negative.     Objective:    Physical Exam Vitals and nursing note reviewed.  Constitutional:      General: She is not in acute distress.    Appearance: Normal appearance. She is normal weight. She is not ill-appearing, toxic-appearing or diaphoretic.  Cardiovascular:     Rate and Rhythm: Normal  rate and regular rhythm.  Pulmonary:     Effort: Pulmonary effort is normal. No respiratory distress.  Skin:    General: Skin is warm and dry.  Neurological:     General: No focal deficit present.     Mental Status: She is alert and oriented to person, place, and time. Mental status is at baseline.  Psychiatric:        Mood and Affect: Mood normal.        Behavior: Behavior normal.        Thought Content: Thought content normal.        Judgment: Judgment normal.     BP 113/73   Pulse 71   Temp 98.2 F (36.8 C) (Temporal)   Ht 5\' 1"  (1.549 m)   Wt 132 lb (59.9 kg)   LMP 08/26/2019   SpO2 98%   BMI 24.94 kg/m  Wt Readings from Last 3 Encounters:  09/16/19 132 lb (59.9 kg)  03/04/19 129 lb (58.5 kg)  04/11/18 126 lb 9.6 oz (57.4 kg)     Health Maintenance Due  Topic Date Due  . Hepatitis C Screening  Never done  . PAP SMEAR-Modifier  07/08/2019  . INFLUENZA VACCINE  08/18/2019    There are no preventive care reminders to display for this patient.  Lab Results  Component Value Date   TSH 1.180 03/04/2019   Lab Results  Component Value Date   WBC 7.2 03/04/2019   HGB 12.9 03/04/2019   HCT 39.5 03/04/2019   MCV 81 03/04/2019   PLT 287 03/04/2019   Lab Results  Component Value Date   NA 139 03/04/2019   K 4.5 03/04/2019   CO2 22 03/04/2019   GLUCOSE 86 03/04/2019   BUN 16 03/04/2019   CREATININE 0.71 03/04/2019   BILITOT 0.8 08/24/2017   ALKPHOS 51 08/24/2017   AST 17 08/24/2017   ALT 17 08/24/2017   PROT 8.3 (H) 08/24/2017   ALBUMIN 4.5 08/24/2017   CALCIUM 10.3 (H) 03/04/2019   ANIONGAP 10 08/24/2017   Lab Results  Component Value Date   CHOL 188 03/04/2019   Lab Results  Component Value Date   HDL 87 03/04/2019   Lab Results  Component Value Date   LDLCALC 89 03/04/2019   Lab Results  Component Value Date   TRIG 64 03/04/2019   Lab Results  Component Value Date   CHOLHDL 2.2 03/04/2019   No results found for: HGBA1C     Assessment & Plan:   Problem List Items Addressed This Visit    None    Visit Diagnoses    Counseling for birth control, oral  contraceptives    -  Primary   Relevant Medications   levonorgestrel-ethinyl estradiol (AVIANE) 0.1-20 MG-MCG tablet   Other Relevant Orders   POCT urine pregnancy (Completed)      Meds ordered this encounter  Medications  . levonorgestrel-ethinyl estradiol (AVIANE) 0.1-20 MG-MCG tablet    Sig: Take 1 tablet by mouth daily.    Dispense:  84 tablet    Refill:  4    Order Specific Question:   Supervising Provider    Answer:   Carlota Raspberry, JEFFREY R [[id number]    Follow-up: No follow-ups on file.   PLAN  Start low dose COC with next normal menses.   Urine preg test negative today  No further unprotected intercourse until 1 week after starting COCs  Patient encouraged to call clinic with any questions, comments, or concerns.  Maximiano Coss, NP

## 2019-11-14 ENCOUNTER — Encounter: Payer: Self-pay | Admitting: Registered Nurse

## 2019-11-15 ENCOUNTER — Other Ambulatory Visit: Payer: Self-pay | Admitting: Registered Nurse

## 2019-11-15 DIAGNOSIS — F4321 Adjustment disorder with depressed mood: Secondary | ICD-10-CM

## 2019-11-15 MED ORDER — FLUOXETINE HCL 40 MG PO CAPS: 40.0000 mg | ORAL_CAPSULE | Freq: Every day | ORAL | 3 refills | Status: DC

## 2019-11-15 NOTE — Telephone Encounter (Signed)
Pt would like to know if youre willing to increase fluoxetine from 20 mg as she has been having a hard time recently. Would you prefer an appointment? Last OV 09/16/2019

## 2019-12-18 DIAGNOSIS — Z20822 Contact with and (suspected) exposure to covid-19: Secondary | ICD-10-CM | POA: Diagnosis not present

## 2019-12-18 DIAGNOSIS — R112 Nausea with vomiting, unspecified: Secondary | ICD-10-CM | POA: Diagnosis not present

## 2020-04-14 ENCOUNTER — Inpatient Hospital Stay (HOSPITAL_COMMUNITY)
Admission: AD | Admit: 2020-04-14 | Discharge: 2020-04-14 | Disposition: A | Discharge status: Home / Self Care | Payer: BC Managed Care – PPO | Attending: Obstetrics and Gynecology | Admitting: Obstetrics and Gynecology

## 2020-04-14 ENCOUNTER — Encounter (HOSPITAL_COMMUNITY): Payer: Self-pay | Admitting: *Deleted

## 2020-04-14 ENCOUNTER — Inpatient Hospital Stay (HOSPITAL_COMMUNITY): Payer: BC Managed Care – PPO

## 2020-04-14 DIAGNOSIS — O209 Hemorrhage in early pregnancy, unspecified: Secondary | ICD-10-CM

## 2020-04-14 DIAGNOSIS — R52 Pain, unspecified: Secondary | ICD-10-CM | POA: Diagnosis not present

## 2020-04-14 DIAGNOSIS — Z3A01 Less than 8 weeks gestation of pregnancy: Secondary | ICD-10-CM | POA: Diagnosis not present

## 2020-04-14 DIAGNOSIS — R1084 Generalized abdominal pain: Secondary | ICD-10-CM | POA: Diagnosis not present

## 2020-04-14 DIAGNOSIS — O00101 Right tubal pregnancy without intrauterine pregnancy: Secondary | ICD-10-CM

## 2020-04-14 DIAGNOSIS — Z3A00 Weeks of gestation of pregnancy not specified: Secondary | ICD-10-CM | POA: Diagnosis not present

## 2020-04-14 LAB — CBC
HCT: 35.3 % — ABNORMAL LOW (ref 36.0–46.0)
Hemoglobin: 11.6 g/dL — ABNORMAL LOW (ref 12.0–15.0)
MCH: 27.3 pg (ref 26.0–34.0)
MCHC: 32.9 g/dL (ref 30.0–36.0)
MCV: 83.1 fL (ref 80.0–100.0)
Platelets: 276 10*3/uL (ref 150–400)
RBC: 4.25 MIL/uL (ref 3.87–5.11)
RDW: 13.4 % (ref 11.5–15.5)
WBC: 10.3 10*3/uL (ref 4.0–10.5)
nRBC: 0 % (ref 0.0–0.2)

## 2020-04-14 LAB — URINALYSIS, ROUTINE W REFLEX MICROSCOPIC
Bilirubin Urine: NEGATIVE
Glucose, UA: NEGATIVE mg/dL
Ketones, ur: NEGATIVE mg/dL
Leukocytes,Ua: NEGATIVE
Nitrite: NEGATIVE
Protein, ur: NEGATIVE mg/dL
Specific Gravity, Urine: 1.017 (ref 1.005–1.030)
pH: 6 (ref 5.0–8.0)

## 2020-04-14 LAB — COMPREHENSIVE METABOLIC PANEL
ALT: 14 U/L (ref 0–44)
AST: 18 U/L (ref 15–41)
Albumin: 3.8 g/dL (ref 3.5–5.0)
Alkaline Phosphatase: 37 U/L — ABNORMAL LOW (ref 38–126)
Anion gap: 6 (ref 5–15)
BUN: 11 mg/dL (ref 6–20)
CO2: 25 mmol/L (ref 22–32)
Calcium: 9.3 mg/dL (ref 8.9–10.3)
Chloride: 104 mmol/L (ref 98–111)
Creatinine, Ser: 0.65 mg/dL (ref 0.44–1.00)
GFR, Estimated: >60 mL/min (ref >60)
Glucose, Bld: 104 mg/dL — ABNORMAL HIGH (ref 70–99)
Potassium: 3.9 mmol/L (ref 3.5–5.1)
Sodium: 135 mmol/L (ref 135–145)
Total Bilirubin: 0.5 mg/dL (ref 0.3–1.2)
Total Protein: 7.2 g/dL (ref 6.5–8.1)

## 2020-04-14 LAB — WET PREP, GENITAL
Clue Cells Wet Prep HPF POC: NONE SEEN
Sperm: NONE SEEN
Trich, Wet Prep: NONE SEEN
Yeast Wet Prep HPF POC: NONE SEEN

## 2020-04-14 LAB — GC/CHLAMYDIA PROBE AMP (~~LOC~~) NOT AT ARMC
Chlamydia: NEGATIVE
Comment: NEGATIVE
Comment: NORMAL
Neisseria Gonorrhea: NEGATIVE

## 2020-04-14 LAB — HCG, QUANTITATIVE, PREGNANCY: hCG, Beta Chain, Quant, S: 1193 m[IU]/mL — ABNORMAL HIGH (ref <5)

## 2020-04-14 LAB — ABO/RH: ABO/RH(D): A POS

## 2020-04-14 LAB — POCT PREGNANCY, URINE: Preg Test, Ur: POSITIVE — AB

## 2020-04-14 MED ORDER — ONDANSETRON 4 MG PO TBDP
8.0000 mg | ORAL_TABLET | Freq: Once | ORAL | Status: AC
  Administered 2020-04-14: 8 mg via ORAL
  Filled 2020-04-14: qty 2

## 2020-04-14 MED ORDER — METHOTREXATE FOR ECTOPIC PREGNANCY: 50.0000 mg/m2 | Freq: Once | INTRAMUSCULAR | Status: DC

## 2020-04-14 MED ORDER — MORPHINE SULFATE (PF) 4 MG/ML IV SOLN
2.0000 mg | Freq: Once | INTRAVENOUS | Status: AC
  Administered 2020-04-14: 2 mg via INTRAMUSCULAR
  Filled 2020-04-14: qty 1

## 2020-04-14 MED ORDER — METHOTREXATE SODIUM CHEMO INJECTION 50 MG/2ML
50.0000 mg/m2 | Freq: Once | INTRAMUSCULAR | Status: AC
  Administered 2020-04-14: 82.5 mg via INTRAMUSCULAR
  Filled 2020-04-14: qty 3.3

## 2020-04-14 MED ORDER — PROMETHAZINE HCL 25 MG PO TABS: 25.0000 mg | ORAL_TABLET | Freq: Four times a day (QID) | ORAL | 0 refills | Status: DC | PRN

## 2020-04-14 NOTE — Progress Notes (Signed)
At bedside to review plan of care for ectopic pregnancy.  Pt states she is having intermittent pain on her right side.  Some nausea, no vomiting. Nausea improved s/p medication.  Denies dizziness or headache.  Reviewed medical vs surgical intervention.  She would prefer medical management if possible.    O: BP (!) 93/53   Pulse 81   Temp 98.3 F (36.8 C) (Oral)   Resp 18   Ht 5\' 1"  (1.549 m)   Wt 63 kg   LMP 03/12/2020   SpO2 98%   BMI 26.24 kg/m  BPs reviewed- orthostatic BP above  Gen: alert, mild discomfort Abd: soft, no distension, +RLQ tenderness, no rebound, no guarding  Reviewed ruptured ectopic precautions, discussed importance of close follow up and encouraged pt to return to hospital should she note any acute changes.  She voiced understanding, questions and concerns were addressed.  See MAU provider note for further details regarding management  Janyth Pupa, DO Attending [place], [facility] for [employer], [place]

## 2020-04-14 NOTE — Discharge Instructions (Signed)
Methotrexate Treatment for an Ectopic Pregnancy Methotrexate is a medicine that treats an ectopic pregnancy. In this type of pregnancy, the fertilized egg attaches (implants) outside the uterus. An ectopic pregnancy cannot develop into a healthy baby. Methotrexate works by stopping the growth of the fertilized egg. It also helps the body absorb tissue from the egg. This takes about 2-6 weeks. An ectopic pregnancy can be life-threatening. However, most ectopic pregnancies can be successfully treated with methotrexate if they are diagnosed early. Tell a health care provider about:  Any allergies you have.  All medicines you are taking, including vitamins, herbs, eye drops, creams, and over-the-counter medicines.  Any medical conditions you have. What are the risks? Generally, this is a safe treatment. However, problems may occur, including:  Digestive problems. You may have: ? Nausea. ? Vomiting. ? Diarrhea. ? Cramping in your abdomen.  Bleeding or spotting from your vagina.  Feeling dizzy or light-headed.  Mouth sores.  Inflammation of the lining of your lungs (pneumonitis).  Damage to nearby structures or organs, such as damage to the liver.  Hair loss. There is a risk that methotrexate treatment will fail and the pregnancy will continue. There is also a risk that the ectopic pregnancy might tear or burst (rupture) during use of this medicine. What happens before the procedure?  Blood tests will be done to check how your disease-fighting system (immune system), liver, and kidneys are working.  You will also have blood tests to measure your pregnancy hormone levels and to find out your blood type.  You will be given a shot of a medicine called Rho(D) immune globulin if: ? You are Rh-negative and the father is Rh-positive. ? You are Rh-negative and the father's Rh type is unknown. What happens during the procedure?  Methotrexate will be injected into your  muscle. ? Methotrexate may be given as a single dose of medicine or a series of doses over time, depending on your response to the treatment. ? Methotrexate injections are given by a health care provider. Injection is the most common way that this medicine is used to treat an ectopic pregnancy.  You may also receive other medicines to manage your ectopic pregnancy. The procedure may vary among health care providers and hospitals. What can I expect after treatment? After your treatment, it is common to have:  Cramping in your abdomen.  Bleeding in your vagina.  Tiredness (fatigue).  Nausea.  Vomiting.  Diarrhea. Blood tests will be done at timed intervals for several days or weeks to check your pregnancy hormone levels. The blood tests will be done until the pregnancy hormone can no longer be found in the blood. If the methotrexate treatment does not work, a surgical procedure may be done to remove the ectopic pregnancy. Follow these instructions at home: Medicines  Take over-the-counter and prescription medicines only as told by your health care provider.  Do not take prescription pain medicines, aspirin, ibuprofen, naproxen, or any other NSAIDs.  Do not take folic acid, prenatal vitamins, or other vitamins that contain folic acid. Activity  Do not have sex, douche, or put anything, such as tampons, in your vagina until your health care provider says it is okay.  Limit activities that take a lot of effort as told by your health care provider. General instructions  Do not drink alcohol.  Follow instructions from your health care provider about eating restrictions, such as avoiding foods that produce a lot of gas. These foods can hide the signs of a  ruptured ectopic pregnancy.  Limit exposure to sunlight or artificial UV light such as from tanning beds. Methotrexate can make you more sensitive to the sun.  Follow instructions from your health care provider on how and when to  report any symptoms that may indicate a ruptured ectopic pregnancy.  Keep all follow-up visits. This is important.   Contact a health care provider if:  You have persistent nausea and vomiting.  You have persistent diarrhea.  You are having a reaction to the medicine. This may include: ? Unusual fatigue. ? Skin rash. Get help right away if:  Pain in your abdomen or in the area between your hip bones (pelvic area) gets worse.  You have more bleeding from your vagina.  You feel light-headed or you faint.  You are short of breath.  Your heart rate increases.  You develop a cough.  You have chills or a fever. Summary  Methotrexate is a medicine that treats an ectopic pregnancy. This type of pregnancy forms outside the uterus.  There is a risk that methotrexate treatment will fail and the pregnancy will continue. There is also a risk that the ectopic pregnancy might tear or burst during use of this medicine.  This medicine may be given in a single dose or a series of doses over time.  After your treatment, blood tests will be done at timed intervals for several days or weeks to check your pregnancy hormone levels. The blood tests will be done until no more pregnancy hormone is found in the blood. This information is not intended to replace advice given to you by your health care provider. Make sure you discuss any questions you have with your health care provider. Document Revised: 06/19/2019 Document Reviewed: 06/19/2019 Elsevier Patient Education  2021 [place].     Ectopic Pregnancy  An ectopic pregnancy happens when a fertilized egg grows outside of the womb (uterus). Fertilized means that sperm entered the egg. The egg cannot stay alive outside of the womb. What are the causes? The most common cause is damage to a fallopian tube. This damage stops the egg from getting to the womb. Instead, the egg stays in the tube. Sometimes, an ectopic pregnancy happens in other  parts of the body. What increases the risk?  Getting treatment before to help you have a baby.  A past pregnancy outside of the womb.  A past surgery to have your tubes tied.  Getting pregnant while using a device in the womb to avoid getting pregnant.  Taking birth control pills before the age of 48.  Smoking or drinking alcohol.  Having a mother who took a medicine called DES many years ago. What are the signs or symptoms? Common symptoms of this condition include:  Missing a menstrual period.  Feeling like you may vomit.  Tiredness.  Breast pain.  Other signs that you are pregnant. Other symptoms may include:  Pain during sex.  Bleeding from the vagina.  Belly pain.  A fast heartbeat, low blood pressure, and sweating.  Pain or extra pressure while pooping (having a bowel movement). If your tube tears or bursts:  You may have sudden and very bad pain in your belly.  You may feel dizzy, weak, or light-headed.  You may faint.  You may have pain in your shoulder or neck. A torn or burst tube is an emergency. It can be life-threatening. How is this treated? This condition may be treated with:  Medicine. This may be given if: ?  The pregnancy is found early and you are not bleeding. ? The tube has not torn or burst.  Surgery. This may be done to: ? Take out the pregnancy tissue. ? Stop bleeding. ? Take out part or all of the tube. ? Take out the womb. This is rare.  Blood tests.  Careful watching. Follow these instructions at home: Medicines  Take over-the-counter and prescription medicines only as told by your doctor.  If told, take steps to prevent problems with pooping (constipation). You may need to: ? Drink enough fluid to keep your pee (urine) pale yellow. ? Take medicines. You will be told what medicines to take. ? Eat foods that are high in fiber. These include beans, whole grains, and fresh fruits and vegetables. ? Limit foods that are  high in fat and sugar. These include fried or sweet foods.  Ask your doctor if you should avoid driving or using machines while you are taking your medicine. General instructions  Rest or limit your activities, if told to do this.  Do not have sex for 6 weeks or as told by your doctor.  Do not put tampons, vaginal cleaning wash (douche), or other things in your vagina. Do not use these things for 6 weeks or until your doctor says it is safe to use them.  Do not lift anything that is heavier than 10 lb (4.5 kg), or the limit that you are told.  Return to your normal activities when your doctor says that it is safe.  Keep all follow-up visits. Contact a doctor if:  You have a fever or chills.  You feel like you may vomit and you vomit. Get help right away if:  Your pain gets worse or is not helped by medicine.  You feel dizzy or weak.  You feel light-headed.  You faint.  You have sudden and very bad pain in your belly.  You have very bad pain in your shoulder or neck. Summary  An ectopic pregnancy happens when a fertilized egg grows outside the womb.  This is an emergency.  The most common cause is damage to one of the fallopian tubes.  This condition may be treated with medicine, surgery, blood tests, or careful watching. This information is not intended to replace advice given to you by your health care provider. Make sure you discuss any questions you have with your health care provider. Document Revised: 04/26/2019 Document Reviewed: 04/16/2019 Elsevier Patient Education  [place].

## 2020-04-14 NOTE — MAU Provider Note (Addendum)
Chief Complaint:  Vaginal Bleeding   Event Date/Time   First Provider Initiated Contact with Patient 04/14/20 0446     HPI: Casey Dennis is a 30 y.o. G3P1011 at [redacted]w[redacted]d who presents to maternity admissions reporting post-coital spotting and cramping. Last IC around midnight, then pt woke up with diffuse lower abdominal cramping she thought was gas, tried to "push it out" got dizzy, wiped and saw pink mucus, got dizzier and called EMS. Did not have spotting with her first pregnancy. Has been having headaches off and on for the past two weeks, she says this is typical for PMS. Having some nausea, no vomiting. No constipation or other GI issues, no urinary symptoms or vaginal discharge/odor.   Pregnancy Course: [facility] is PCP, has not established routine OB care yet  Past Medical History:  Diagnosis Date  . Allergy   . Anemia   . Anxiety   . Depression   . Medical history non-contributory    OB History  Gravida Para Term Preterm AB Living  3 1 1   1 1   SAB IAB Ectopic Multiple Live Births    1   0 1    # Outcome Date GA Lbr Len/2nd Weight Sex Delivery Anes PTL Lv  3 Current           2 Term 06/24/14 [redacted]w[redacted]d 07:49 / 01:05 6 lb 4.7 oz (2.855 kg) F Vag-Spont EPI  LIV  1 IAB            Past Surgical History:  Procedure Laterality Date  . NO PAST SURGERIES    . TOOTH EXTRACTION     Family History  Problem Relation Age of Onset  . Cancer Father   . Diabetes Mother   . Mental illness Sister   . Mental illness Brother    Social History   Tobacco Use  . Smoking status: Never Smoker  . Smokeless tobacco: Never Used  Vaping Use  . Vaping Use: Never used  Substance Use Topics  . Alcohol use: No  . Drug use: No   No Known Allergies Medications Prior to Admission  Medication Sig Dispense Refill Last Dose  . Prenatal Vit-Fe Fumarate-FA (PRENATAL MULTIVITAMIN) TABS tablet Take 1 tablet by mouth daily at 12 noon.   04/13/2020 at Unknown time  . FLUoxetine (PROZAC) 40 MG capsule Take 1  capsule (40 mg total) by mouth daily. 90 capsule 3   . levonorgestrel-ethinyl estradiol (AVIANE) 0.1-20 MG-MCG tablet Take 1 tablet by mouth daily. 84 tablet 4    I have reviewed patient's Past Medical Hx, Surgical Hx, Family Hx, Social Hx, medications and allergies.   ROS:  Review of Systems  Constitutional: Negative for fatigue and fever.  HENT: Negative for congestion and sore throat.   Eyes: Negative for visual disturbance.  Respiratory: Negative for cough and shortness of breath.   Gastrointestinal: Positive for abdominal pain and nausea. Negative for constipation and vomiting.  Genitourinary: Positive for vaginal bleeding. Negative for flank pain, pelvic pain and vaginal discharge.  Musculoskeletal: Negative for back pain.  Neurological: Positive for dizziness and headaches. Negative for syncope.  All other systems reviewed and are negative.  Physical Exam   Patient Vitals for the past 24 hrs:  BP Temp Temp src Pulse Resp Height Weight  04/14/20 0409 110/62 98.5 F (36.9 C) Oral 68 18 5\' 1"  (1.549 m) 138 lb 14.4 oz (63 kg)   Constitutional: Well-developed, well-nourished female in no acute distress.  Cardiovascular: normal rate & rhythm,  no murmur Respiratory: normal effort, lung sounds clear throughout GI: Abd soft, non-tender, gravid appropriate for gestational age. Pos BS x 4 MS: Extremities nontender, no edema, normal ROM Neurologic: Alert and oriented x 4.  GU: no CVA tenderness Pelvic exam deferred, blind swabs obtained (pink noted). Normal external female genitalia with no blood/discharge noted externally.    Labs: Results for orders placed or performed during the hospital encounter of 04/14/20 (from the past 24 hour(s))  Pregnancy, urine POC     Status: Abnormal   Collection Time: 04/14/20  4:07 AM  Result Value Ref Range   Preg Test, Ur POSITIVE (A) NEGATIVE  Urinalysis, Routine w reflex microscopic Urine, Clean Catch     Status: Abnormal   Collection Time:  04/14/20  4:09 AM  Result Value Ref Range   Color, Urine YELLOW YELLOW   APPearance HAZY (A) CLEAR   Specific Gravity, Urine 1.017 1.005 - 1.030   pH 6.0 5.0 - 8.0   Glucose, UA NEGATIVE NEGATIVE mg/dL   Hgb urine dipstick MODERATE (A) NEGATIVE   Bilirubin Urine NEGATIVE NEGATIVE   Ketones, ur NEGATIVE NEGATIVE mg/dL   Protein, ur NEGATIVE NEGATIVE mg/dL   Nitrite NEGATIVE NEGATIVE   Leukocytes,Ua NEGATIVE NEGATIVE   RBC / HPF 6-10 0 - 5 RBC/hpf   WBC, UA 0-5 0 - 5 WBC/hpf   Bacteria, UA RARE (A) NONE SEEN   Squamous Epithelial / LPF 0-5 0 - 5   Mucus PRESENT    Imaging:  No results found.  MAU Course: Orders Placed This Encounter  Procedures  . Wet prep, genital  . US OB LESS THAN 14 WEEKS WITH OB TRANSVAGINAL  . Urinalysis, Routine w reflex microscopic Urine, Clean Catch  . CBC  . hCG, quantitative, pregnancy  . Pregnancy, urine POC  . ABO/Rh   No orders of the defined types were placed in this encounter.  MDM: Ectopic/preeclamptic workup ordered  Pain worsened from 3/10-7/10 in U/S, pt also began to feel very nauseated. Medicated with zofran and morphine upon return to MAU. Vitals stable. Pt expressed relief of nausea, pain and lightheadedness.  Radiology called to report right adnexal ectopic with trace free fluid, pt should be appropriate for MTX if labs appropriate, MD made aware of presentation and findings - agreed MTX therapy appropriate.  CBC and CMP normal, hcg= 1193 Blood type A+  Findings and treatment plan reviewed with pt, condolences given. Pt agreed to MTX therapy. Orders placed.  Report given to Jorje Guild, NP at [phone]. Gaylan Gerold, CNM, MSN, [place] Certified Nurse Midwife, [facility]    Patient reports worsening abdomina pain, now 8/10. TTP in RLQ. Dr. Nelda Marseille called to unit to assess patient prior to MTX administration- see note. Pts preference is still to received MTX.    The risks of methotrexate were reviewed including  failure requiring repeat dosing or eventual surgery. She understands that methotrexate involves frequent return visits to monitor lab values and that she remains at risk of ectopic rupture until her beta is less than assay. ?The patient opts to proceed with methotrexate.  She has no history of hepatic or renal dysfunction, has normal BUN/Cr/LFT's/platelets.  She is felt to be reliable for follow-up. Side effects of photosensitivity & GI upset were discussed.  She knows to avoid direct sunlight and abstain from alcohol, aspirin and aspirin-like products for two weeks. She was counseled to discontinue any MVI with folic acid. ?She understands to follow up on D4 (Friday) and D7 (Monday)  for repeat BHCG and was given the instruction sheet. ?Strict ectopic precautions were reviewed, the patient knows to call with any abdominal pain, vomiting, fainting, or any concerns with her health.  Rh-, Rhogam administered.     Assessment: 1. Right tubal pregnancy without intrauterine pregnancy   2. Vaginal bleeding in pregnancy, first trimester   3. [redacted] weeks gestation of pregnancy     Plan: Discharge home Strict return precautions r/t ectopic pregnancy Rx phenergan Scheduled day 4 stat hcg for Friday  Jorje Guild, NP   Attestation of Attending Supervision of Advanced Practice Provider (PA/CNM/NP): Evaluation and management procedures were performed by the Advanced Practice Provider under my supervision and collaboration.  I have reviewed the Advanced Practice Provider's note and chart, and I agree with the management and plan. I have also made any necessary editorial changes.   Annalee Genta, DO Attending [place], [facility] for [facility], [place] [facility] 04/14/2020 11:01 AM

## 2020-04-14 NOTE — MAU Note (Signed)
PT SAYS AT 0330- WOKE - FELT LOWER ABD PAIN -  WENT TO B-ROOM- HAD SPOTTING ON TP  AND FELT DIZZY .  NO DIZZINESS NOW.  HPT POSITIVE - LAST WED.

## 2020-04-17 ENCOUNTER — Ambulatory Visit (INDEPENDENT_AMBULATORY_CARE_PROVIDER_SITE_OTHER): Payer: BC Managed Care – PPO | Admitting: *Deleted

## 2020-04-17 ENCOUNTER — Other Ambulatory Visit: Payer: Self-pay

## 2020-04-17 ENCOUNTER — Encounter: Payer: Self-pay | Admitting: *Deleted

## 2020-04-17 VITALS — BP 109/68 | HR 83 | Temp 98.2°F | Ht 61.0 in | Wt 138.9 lb

## 2020-04-17 DIAGNOSIS — O00101 Right tubal pregnancy without intrauterine pregnancy: Secondary | ICD-10-CM | POA: Diagnosis not present

## 2020-04-17 LAB — BETA HCG QUANT (REF LAB): hCG Quant: 360 m[IU]/mL

## 2020-04-17 NOTE — Progress Notes (Signed)
Pt presents for stat BHCG on [place] [facility] #4 following MTX injection. She reports having some cramps this morning. She also has small amount of brown vaginal bleeding. Pt was advised that she will be called with results later today. She should return to MAU if she develops heavy vaginal bleeding or severe abdominal pain. Pt voiced understanding and stated that a detailed message can be left on her voicemail if she does not answer.   1150  BHCG results reviewed by Dr. Rip Harbour who finds appropriate decrease in hormone following MTX. Pt will require BHCG on [place] #7 which will be 4/4.  1310  Called pt and left voicemail message stating that the hormone level has decreased as expected. She will need repeat test on 4/4 @ 8:30. Per chart review, pt has viewed the message which was sent to her by Dr. Rip Harbour regarding results and next steps of care.

## 2020-04-19 NOTE — Progress Notes (Signed)
Agree with A & P. 

## 2020-04-20 ENCOUNTER — Telehealth: Payer: Self-pay | Admitting: *Deleted

## 2020-04-20 ENCOUNTER — Ambulatory Visit: Payer: BC Managed Care – PPO

## 2020-04-20 NOTE — Telephone Encounter (Signed)
Patient DNKA stat bhcg day 7. I called her and left a message I am calling to let you know you missed an important appointment today- please call us or just come on into office for your appointment.  I will also send a MYChart message to notify her.  Olof Marcil,RN

## 2020-05-04 ENCOUNTER — Encounter: Payer: Self-pay | Admitting: *Deleted

## 2020-05-04 NOTE — Progress Notes (Signed)
Pt DNKA last stat bhcg on 04/20/20, was called and a message left; also MyChart message was sent.  I have received notification patient has not read MyChart message and per chart has not called and rescheduled appointment. Will send letter to address on file. Sala Tague,RN

## 2020-09-07 ENCOUNTER — Encounter: Payer: Self-pay | Admitting: Registered Nurse

## 2020-09-09 ENCOUNTER — Other Ambulatory Visit: Payer: Self-pay

## 2020-09-09 ENCOUNTER — Encounter: Payer: Self-pay | Admitting: Registered Nurse

## 2020-09-09 ENCOUNTER — Telehealth (INDEPENDENT_AMBULATORY_CARE_PROVIDER_SITE_OTHER): Payer: BC Managed Care – PPO | Admitting: Registered Nurse

## 2020-09-09 DIAGNOSIS — F4321 Adjustment disorder with depressed mood: Secondary | ICD-10-CM

## 2020-09-09 MED ORDER — FLUOXETINE HCL 20 MG PO CAPS: 20.0000 mg | ORAL_CAPSULE | Freq: Every day | ORAL | 0 refills | Status: DC

## 2020-09-09 MED ORDER — FLUOXETINE HCL 40 MG PO CAPS: 40.0000 mg | ORAL_CAPSULE | Freq: Every day | ORAL | 3 refills | Status: DC

## 2020-09-09 NOTE — Progress Notes (Signed)
Telemedicine Encounter- SOAP NOTE Established Patient  This telephone encounter was conducted with the patient's (or proxy's) verbal consent via audio telecommunications: yes  Patient was instructed to have this encounter in a suitably private space; and to only have persons present to whom they give permission to participate. In addition, patient identity was confirmed by use of name plus two identifiers (DOB and address).  I discussed the limitations, risks, security and privacy concerns of performing an evaluation and management service by telephone and the availability of in person appointments. I also discussed with the patient that there may be a patient responsible charge related to this service. The patient expressed understanding and agreed to proceed.  I spent a total of 22 minutes talking with the patient or their proxy.  Patient at home Provider in office  Participants: Kathrin Ruddy, NP and Marchelle Gearing  Chief Complaint  Patient presents with   Medication Reaction    Patient states she is having a medication reaction to Prozac. Patient states she has been having some stiffness in her hands, fatigue and not feeling her self.    Subjective   Casey Dennis is a 30 y.o. established patient. Telephone visit today for med check  HPI Restarted prozac last Monday after break Had reaction starting this Monday - hand stiffness - since resolved Also having some fatigue. Mental health vs. Med AE.  Wants to try medication for another few weeks before decided whether or not to change.  Notes she is no longer breastfeeding.  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 30 capsule 0   FLUoxetine (PROZAC) 40 MG capsule Take 1 capsule (40 mg total) by mouth daily. 90 capsule 3   promethazine (PHENERGAN) 25 MG tablet Take 1 tablet (25 mg total) by mouth every 6 (six) hours as needed for nausea or vomiting. 30 tablet 0   No current facility-administered medications for this visit.    No Known Allergies  Social History   Socioeconomic History   Marital status: Married    Spouse name: Not on file   Number of children: 1   Years of education: Not on file   Highest education level: Not on file  Occupational History   Not on file  Tobacco Use   Smoking status: Never   Smokeless tobacco: Never  Vaping Use   Vaping Use: Never used  Substance and Sexual Activity   Alcohol use: No   Drug use: No   Sexual activity: Yes    Birth control/protection: None  Other Topics Concern   Not on file  Social History Narrative   Not on file   Social Determinants of Health   Financial Resource Strain: Not on file  Food Insecurity: Not on file  Transportation Needs: Not on file  Physical Activity: Not on file  Stress: Not on file  Social Connections: Not on file  Intimate Partner Violence: Not on file    Review of Systems  Constitutional:  Positive for malaise/fatigue. Negative for chills, diaphoresis, fever and weight loss.  HENT: Negative.    Eyes: Negative.   Respiratory: Negative.  Cardiovascular: Negative.   Gastrointestinal: Negative.   Genitourinary: Negative.   Musculoskeletal: Negative.   Skin: Negative.   Neurological: Negative.   Endo/Heme/Allergies: Negative.   Psychiatric/Behavioral: Negative.    All other systems reviewed and are negative.  Objective   Vitals as reported by the patient: There were no vitals filed for this visit.  [name] was seen today for medication reaction.  Diagnoses and all orders for this visit:  Complicated grief -     FLUoxetine (PROZAC) 20 MG capsule; Take 1 capsule (20 mg total) by mouth  daily. -     FLUoxetine (PROZAC) 40 MG capsule; Take 1 capsule (40 mg total) by mouth daily.   PLAN Refill fluoxetine as above. May do best to titrate up. Return for med check in 6-8 weeks Patient encouraged to call clinic with any questions, comments, or concerns.  I discussed the assessment and treatment plan with the patient. The patient was provided an opportunity to ask questions and all were answered. The patient agreed with the plan and demonstrated an understanding of the instructions.   The patient was advised to call back or seek an in-person evaluation if the symptoms worsen or if the condition fails to improve as anticipated.  I provided 22 minutes of non-face-to-face time during this encounter.  Maximiano Coss, NP

## 2020-10-01 ENCOUNTER — Ambulatory Visit: Payer: BC Managed Care – PPO | Admitting: Family

## 2020-12-21 ENCOUNTER — Encounter: Payer: Self-pay | Admitting: Registered Nurse

## 2021-01-17 NOTE — L&D Delivery Note (Signed)
DELIVERY NOTE  Pt complete and at +2 station with urge to push. Epidural controlling pain. Pt pushed and delivered a viable female infant in LOA position. Anterior and posterior shoulders spontaneously delivered with next two pushes; body easily followed next. Infant placed on mothers abdomen and bulb suction of mouth and nose performed. Cord was then clamped and cut by FOB. Cord blood obtained, 3VC. Baby had a vigorous spontaneous cry noted. Placenta then delivered at 0915 intact. Fundal massage performed and pitocin per protocol. Fundus firm. The following lacerations were noted: left periurethral. Repaired in routine fashion with 3-0 vicryl Mother and baby stable. Counts correct   Infant time: 0912 Gender: female, desires circ Placenta time: 0915 Apgars: 9/9 Weight: pending skin-to-skin

## 2021-01-20 DIAGNOSIS — Z113 Encounter for screening for infections with a predominantly sexual mode of transmission: Secondary | ICD-10-CM | POA: Diagnosis not present

## 2021-01-20 DIAGNOSIS — Z124 Encounter for screening for malignant neoplasm of cervix: Secondary | ICD-10-CM | POA: Diagnosis not present

## 2021-01-20 DIAGNOSIS — Z6826 Body mass index (BMI) 26.0-26.9, adult: Secondary | ICD-10-CM | POA: Diagnosis not present

## 2021-01-20 DIAGNOSIS — Z3480 Encounter for supervision of other normal pregnancy, unspecified trimester: Secondary | ICD-10-CM | POA: Diagnosis not present

## 2021-01-20 DIAGNOSIS — N925 Other specified irregular menstruation: Secondary | ICD-10-CM | POA: Diagnosis not present

## 2021-01-20 DIAGNOSIS — Z3201 Encounter for pregnancy test, result positive: Secondary | ICD-10-CM | POA: Diagnosis not present

## 2021-01-20 LAB — OB RESULTS CONSOLE GC/CHLAMYDIA
Chlamydia: NEGATIVE
Neisseria Gonorrhea: NEGATIVE

## 2021-02-01 DIAGNOSIS — Z369 Encounter for antenatal screening, unspecified: Secondary | ICD-10-CM | POA: Diagnosis not present

## 2021-02-01 DIAGNOSIS — Z3481 Encounter for supervision of other normal pregnancy, first trimester: Secondary | ICD-10-CM | POA: Diagnosis not present

## 2021-02-01 DIAGNOSIS — Z3480 Encounter for supervision of other normal pregnancy, unspecified trimester: Secondary | ICD-10-CM | POA: Diagnosis not present

## 2021-02-01 LAB — OB RESULTS CONSOLE HIV ANTIBODY (ROUTINE TESTING): HIV: NONREACTIVE

## 2021-02-01 LAB — OB RESULTS CONSOLE HEPATITIS B SURFACE ANTIGEN: Hepatitis B Surface Ag: NEGATIVE

## 2021-02-01 LAB — HEPATITIS C ANTIBODY: HCV Ab: NEGATIVE

## 2021-02-01 LAB — OB RESULTS CONSOLE RPR: RPR: NONREACTIVE

## 2021-02-01 LAB — OB RESULTS CONSOLE RUBELLA ANTIBODY, IGM: Rubella: NON-IMMUNE/NOT IMMUNE

## 2021-02-01 LAB — OB RESULTS CONSOLE ANTIBODY SCREEN: Antibody Screen: NEGATIVE

## 2021-03-15 DIAGNOSIS — Z369 Encounter for antenatal screening, unspecified: Secondary | ICD-10-CM | POA: Diagnosis not present

## 2021-03-15 DIAGNOSIS — Z3482 Encounter for supervision of other normal pregnancy, second trimester: Secondary | ICD-10-CM | POA: Diagnosis not present

## 2021-04-19 DIAGNOSIS — Z3A22 22 weeks gestation of pregnancy: Secondary | ICD-10-CM | POA: Diagnosis not present

## 2021-04-19 DIAGNOSIS — Z363 Encounter for antenatal screening for malformations: Secondary | ICD-10-CM | POA: Diagnosis not present

## 2021-04-20 ENCOUNTER — Other Ambulatory Visit: Payer: Self-pay | Admitting: Obstetrics and Gynecology

## 2021-04-20 DIAGNOSIS — Z3689 Encounter for other specified antenatal screening: Secondary | ICD-10-CM

## 2021-04-22 ENCOUNTER — Other Ambulatory Visit: Payer: Self-pay

## 2021-05-12 ENCOUNTER — Ambulatory Visit: Payer: BC Managed Care – PPO

## 2021-05-12 ENCOUNTER — Other Ambulatory Visit: Payer: Self-pay

## 2021-05-12 DIAGNOSIS — M5489 Other dorsalgia: Secondary | ICD-10-CM | POA: Diagnosis not present

## 2021-05-17 ENCOUNTER — Other Ambulatory Visit: Payer: Self-pay | Admitting: *Deleted

## 2021-05-17 ENCOUNTER — Ambulatory Visit (HOSPITAL_BASED_OUTPATIENT_CLINIC_OR_DEPARTMENT_OTHER): Payer: BC Managed Care – PPO

## 2021-05-17 ENCOUNTER — Ambulatory Visit: Payer: BC Managed Care – PPO | Attending: Obstetrics and Gynecology | Admitting: *Deleted

## 2021-05-17 ENCOUNTER — Ambulatory Visit: Payer: BC Managed Care – PPO | Attending: Obstetrics and Gynecology | Admitting: Obstetrics and Gynecology

## 2021-05-17 ENCOUNTER — Other Ambulatory Visit: Payer: Self-pay | Admitting: Obstetrics and Gynecology

## 2021-05-17 VITALS — BP 113/66 | HR 76

## 2021-05-17 DIAGNOSIS — Z3689 Encounter for other specified antenatal screening: Secondary | ICD-10-CM | POA: Diagnosis not present

## 2021-05-17 DIAGNOSIS — O365920 Maternal care for other known or suspected poor fetal growth, second trimester, not applicable or unspecified: Secondary | ICD-10-CM | POA: Insufficient documentation

## 2021-05-17 DIAGNOSIS — O283 Abnormal ultrasonic finding on antenatal screening of mother: Secondary | ICD-10-CM

## 2021-05-17 DIAGNOSIS — Z3A26 26 weeks gestation of pregnancy: Secondary | ICD-10-CM | POA: Insufficient documentation

## 2021-05-17 DIAGNOSIS — O365921 Maternal care for other known or suspected poor fetal growth, second trimester, fetus 1: Secondary | ICD-10-CM

## 2021-05-17 DIAGNOSIS — Z363 Encounter for antenatal screening for malformations: Secondary | ICD-10-CM | POA: Insufficient documentation

## 2021-05-17 NOTE — Progress Notes (Signed)
Maternal-Fetal Medicine  ? ?Name: Casey Dennis ?DOB: [birth year] ?MRN: [id number] ?Referring Provider: Irene Pap, MD ? ?I had the pleasure of seeing Casey Dennis today at the [facility]. She is G4 P1021 at 26-weeks' gestation and is here for a second opinion.  At your office ultrasound, that circumference measurement was at the 2nd percentile and estimated fetal weight was at the 18th percentile. ?On cell-free fetal DNA screening, the risks of fetal aneuploidies are not increased.  MSAFP screening showed low risk for open neural tube defects.  Her pregnancy is well dated by LMP date consistent with 9-week ultrasound. ? ?Obstetric history significant for a term vaginal delivery of a female infant weighing 6 pounds and 5 ounces at birth.  Her pregnancy and delivery were uncomplicated. ?GYN history: No history of abnormal Pap smears or cervical surgeries. ?Past medical history: No history of hypertension or diabetes or any chronic medical conditions. ?Past surgical history: Nil of note. ?Medications: Prenatal vitamins. ?Allergies: No known drug allergies. ?Social history: Denies tobacco or drug or alcohol use. ?Family history: Sister had deep vein thrombosis. ? ?Ultrasound ?On today's ultrasound, the estimated fetal weight was at the 7th percentile.  Head and abdominal circumference measurements are within normal range.  Amniotic fluid is normal and good fetal activity seen.  No markers of aneuploidies or fetal structural defects are seen.  Umbilical artery Doppler showed normal forward diastolic flow. ? ?Fetal growth restriction ?I explained the finding of fetal growth restriction that is difficult to differentiate from a constitutionally small for gestational age fetus. ?I discussed the possible causes of fetal growth restriction including placental insufficiency (most common cause), chromosomal anomalies and fetal infections.  Patient did not give history of fever or rashes. ?I explained that  only amniocentesis will give a definitive result on the fetal karyotype and some genetic conditions Psychologist, counselling).  I explained amniocentesis procedure and possible complication of preterm delivery (1 and 500 procedures). Patient opted not to have amniocentesis. ?I discussed ultrasound protocol of monitoring fetal growth restriction. ?Timing of delivery: Since small for gestational age fetuses have a higher chance of having perinatal mortality and morbidity, delivery at 53 to [redacted] weeks gestation is reasonable.  If, however, severe fetal growth restriction is seen with normal antenatal testing, we will recommend delivery at [redacted] weeks gestation. ? ?Recommendations ?-An appointment was made for her to return in 2 weeks for umbilical artery Doppler study and in 3 weeks for fetal growth assessment. ?-We will plan frequency of antenatal testing after follow-up fetal growth assessment. ? ?Thank you for consultation.  If you have any questions or concerns, please contact me the [facility].  Consultation including face-to-face (more than 50%) counseling 30 minutes. ? ?

## 2021-05-19 DIAGNOSIS — Z3480 Encounter for supervision of other normal pregnancy, unspecified trimester: Secondary | ICD-10-CM | POA: Diagnosis not present

## 2021-05-19 DIAGNOSIS — Z369 Encounter for antenatal screening, unspecified: Secondary | ICD-10-CM | POA: Diagnosis not present

## 2021-05-25 DIAGNOSIS — O99810 Abnormal glucose complicating pregnancy: Secondary | ICD-10-CM | POA: Diagnosis not present

## 2021-05-31 ENCOUNTER — Encounter: Payer: Self-pay | Admitting: *Deleted

## 2021-05-31 ENCOUNTER — Ambulatory Visit: Payer: BC Managed Care – PPO | Attending: Obstetrics and Gynecology

## 2021-05-31 ENCOUNTER — Ambulatory Visit: Payer: BC Managed Care – PPO | Admitting: *Deleted

## 2021-05-31 VITALS — BP 112/51 | HR 67

## 2021-05-31 DIAGNOSIS — Z3A28 28 weeks gestation of pregnancy: Secondary | ICD-10-CM

## 2021-05-31 DIAGNOSIS — O365930 Maternal care for other known or suspected poor fetal growth, third trimester, not applicable or unspecified: Secondary | ICD-10-CM | POA: Diagnosis not present

## 2021-05-31 DIAGNOSIS — O365921 Maternal care for other known or suspected poor fetal growth, second trimester, fetus 1: Secondary | ICD-10-CM | POA: Diagnosis not present

## 2021-06-07 ENCOUNTER — Ambulatory Visit (HOSPITAL_BASED_OUTPATIENT_CLINIC_OR_DEPARTMENT_OTHER): Payer: BC Managed Care – PPO | Admitting: *Deleted

## 2021-06-07 ENCOUNTER — Ambulatory Visit: Payer: BC Managed Care – PPO | Admitting: *Deleted

## 2021-06-07 ENCOUNTER — Other Ambulatory Visit: Payer: Self-pay | Admitting: *Deleted

## 2021-06-07 ENCOUNTER — Ambulatory Visit: Payer: BC Managed Care – PPO | Attending: Obstetrics and Gynecology

## 2021-06-07 VITALS — BP 121/52 | HR 80

## 2021-06-07 DIAGNOSIS — O365921 Maternal care for other known or suspected poor fetal growth, second trimester, fetus 1: Secondary | ICD-10-CM | POA: Insufficient documentation

## 2021-06-07 DIAGNOSIS — O365930 Maternal care for other known or suspected poor fetal growth, third trimester, not applicable or unspecified: Secondary | ICD-10-CM

## 2021-06-07 DIAGNOSIS — Z3A29 29 weeks gestation of pregnancy: Secondary | ICD-10-CM

## 2021-06-07 NOTE — Procedures (Signed)
Casey Dennis 08/05/1990 [redacted]w[redacted]d Fetus A Non-Stress Test Interpretation for 06/07/21  Indication: IUGR  Fetal Heart Rate A Mode: External Baseline Rate (A): 140 bpm Variability: Moderate Accelerations: 10 x 10 Decelerations: None Multiple birth?: No  Uterine Activity Mode: Palpation, Toco Contraction Frequency (min): None  Interpretation (Fetal Testing) Nonstress Test Interpretation: Reactive Overall Impression: Reassuring for gestational age Comments: Dr. SDonalee Citrinreviewed tracing.

## 2021-06-18 DIAGNOSIS — Z23 Encounter for immunization: Secondary | ICD-10-CM | POA: Diagnosis not present

## 2021-06-18 LAB — HM PAP SMEAR: HM Pap smear: NORMAL

## 2021-06-22 ENCOUNTER — Encounter: Payer: Self-pay | Admitting: Registered Nurse

## 2021-06-24 ENCOUNTER — Encounter: Payer: Self-pay | Admitting: Registered Nurse

## 2021-06-24 ENCOUNTER — Other Ambulatory Visit: Payer: Self-pay | Admitting: Registered Nurse

## 2021-06-24 ENCOUNTER — Ambulatory Visit (HOSPITAL_BASED_OUTPATIENT_CLINIC_OR_DEPARTMENT_OTHER): Payer: BC Managed Care – PPO | Admitting: *Deleted

## 2021-06-24 ENCOUNTER — Ambulatory Visit: Payer: BC Managed Care – PPO | Admitting: *Deleted

## 2021-06-24 ENCOUNTER — Ambulatory Visit: Payer: BC Managed Care – PPO | Attending: Obstetrics and Gynecology

## 2021-06-24 VITALS — BP 116/60 | HR 74

## 2021-06-24 DIAGNOSIS — F4321 Adjustment disorder with depressed mood: Secondary | ICD-10-CM

## 2021-06-24 DIAGNOSIS — O365930 Maternal care for other known or suspected poor fetal growth, third trimester, not applicable or unspecified: Secondary | ICD-10-CM | POA: Insufficient documentation

## 2021-06-24 DIAGNOSIS — Z3A31 31 weeks gestation of pregnancy: Secondary | ICD-10-CM | POA: Insufficient documentation

## 2021-06-24 DIAGNOSIS — O365931 Maternal care for other known or suspected poor fetal growth, third trimester, fetus 1: Secondary | ICD-10-CM | POA: Diagnosis not present

## 2021-06-24 MED ORDER — FLUOXETINE HCL 20 MG PO CAPS: 20.0000 mg | ORAL_CAPSULE | Freq: Every day | ORAL | 3 refills | Status: DC

## 2021-06-24 NOTE — Procedures (Signed)
Casey Dennis 10/11/1990 [redacted]w[redacted]d Fetus A Non-Stress Test Interpretation for 06/24/21  Indication: IUGR  Fetal Heart Rate A Mode: External Baseline Rate (A): 150 bpm Variability: Moderate Accelerations: 15 x 15 Decelerations: None Multiple birth?: No  Uterine Activity Mode: Toco Contraction Frequency (min): none Resting Tone Palpated: Relaxed  Interpretation (Fetal Testing) Nonstress Test Interpretation: Reactive Overall Impression: Reassuring for gestational age Comments: tracing reviewed by Dr. SDonalee Citrin

## 2021-06-25 NOTE — Telephone Encounter (Signed)
Message sent to pt from Dr. Birdie Riddle.

## 2021-06-25 NOTE — Telephone Encounter (Signed)
I know Casey Dennis was probably aware that patient was pregnant , but since patient just started back on medication and I looked at some of the OBGYN notes I just wanted to make sure this medication is okay for the patient.

## 2021-06-29 ENCOUNTER — Ambulatory Visit: Payer: BC Managed Care – PPO | Attending: Obstetrics and Gynecology

## 2021-06-29 ENCOUNTER — Ambulatory Visit: Payer: BC Managed Care – PPO | Admitting: *Deleted

## 2021-06-29 ENCOUNTER — Encounter: Payer: Self-pay | Admitting: *Deleted

## 2021-06-29 VITALS — BP 115/63 | HR 76

## 2021-06-29 DIAGNOSIS — Z3A32 32 weeks gestation of pregnancy: Secondary | ICD-10-CM

## 2021-06-29 DIAGNOSIS — O365930 Maternal care for other known or suspected poor fetal growth, third trimester, not applicable or unspecified: Secondary | ICD-10-CM

## 2021-06-29 NOTE — Procedures (Signed)
Casey Dennis Aug 10, 1990 [redacted]w[redacted]d Fetus A Non-Stress Test Interpretation for 06/29/21  Indication: IUGR  Fetal Heart Rate A Mode: External Baseline Rate (A): 150 bpm Variability: Moderate Accelerations: 10 x 10, 15 x 15 Decelerations: None Multiple birth?: No  Uterine Activity Mode: Toco Contraction Frequency (min): 1 u/c Contraction Duration (sec): 40 Contraction Quality: Mild Resting Tone Palpated: Relaxed  Interpretation (Fetal Testing) Nonstress Test Interpretation: Reactive Overall Impression: Reassuring for gestational age Comments: tracing reviewed by Dr. FAnnamaria Boots

## 2021-07-02 DIAGNOSIS — Z369 Encounter for antenatal screening, unspecified: Secondary | ICD-10-CM | POA: Diagnosis not present

## 2021-07-05 ENCOUNTER — Encounter: Payer: Self-pay | Admitting: *Deleted

## 2021-07-05 ENCOUNTER — Ambulatory Visit: Payer: BC Managed Care – PPO | Attending: Obstetrics and Gynecology

## 2021-07-05 ENCOUNTER — Ambulatory Visit: Payer: BC Managed Care – PPO | Admitting: *Deleted

## 2021-07-05 ENCOUNTER — Other Ambulatory Visit: Payer: Self-pay | Admitting: *Deleted

## 2021-07-05 VITALS — BP 117/57 | HR 70

## 2021-07-05 DIAGNOSIS — Z3A33 33 weeks gestation of pregnancy: Secondary | ICD-10-CM | POA: Diagnosis not present

## 2021-07-05 DIAGNOSIS — O365930 Maternal care for other known or suspected poor fetal growth, third trimester, not applicable or unspecified: Secondary | ICD-10-CM

## 2021-07-05 NOTE — Procedures (Signed)
Casey Dennis October 09, 1990 [redacted]w[redacted]d Fetus A Non-Stress Test Interpretation for 07/05/21  Indication: IUGR  Fetal Heart Rate A Mode: External Baseline Rate (A): 145 bpm Variability: Moderate Accelerations: 15 x 15 Decelerations: None Multiple birth?: No  Uterine Activity Mode: Palpation, Toco Contraction Frequency (min): 1 UC Contraction Quality: Mild Resting Tone Palpated: Relaxed Resting Time: Adequate  Interpretation (Fetal Testing) Nonstress Test Interpretation: Reactive Comments: Dr. FAnnamaria Bootsreviewed tracing.

## 2021-07-12 ENCOUNTER — Ambulatory Visit: Payer: BC Managed Care – PPO | Admitting: *Deleted

## 2021-07-12 ENCOUNTER — Ambulatory Visit: Payer: BC Managed Care – PPO | Attending: Obstetrics

## 2021-07-12 ENCOUNTER — Encounter: Payer: Self-pay | Admitting: *Deleted

## 2021-07-12 ENCOUNTER — Other Ambulatory Visit: Payer: Self-pay | Admitting: Obstetrics

## 2021-07-12 ENCOUNTER — Other Ambulatory Visit: Payer: Self-pay | Admitting: *Deleted

## 2021-07-12 VITALS — BP 110/58 | HR 74

## 2021-07-12 DIAGNOSIS — O283 Abnormal ultrasonic finding on antenatal screening of mother: Secondary | ICD-10-CM | POA: Insufficient documentation

## 2021-07-12 DIAGNOSIS — O365930 Maternal care for other known or suspected poor fetal growth, third trimester, not applicable or unspecified: Secondary | ICD-10-CM

## 2021-07-12 DIAGNOSIS — Z3A34 34 weeks gestation of pregnancy: Secondary | ICD-10-CM | POA: Diagnosis not present

## 2021-07-15 DIAGNOSIS — Z369 Encounter for antenatal screening, unspecified: Secondary | ICD-10-CM | POA: Diagnosis not present

## 2021-07-19 ENCOUNTER — Ambulatory Visit: Payer: BC Managed Care – PPO | Attending: Obstetrics

## 2021-07-19 ENCOUNTER — Encounter: Payer: Self-pay | Admitting: *Deleted

## 2021-07-19 ENCOUNTER — Ambulatory Visit: Payer: BC Managed Care – PPO | Admitting: *Deleted

## 2021-07-19 VITALS — BP 113/65 | HR 88

## 2021-07-19 DIAGNOSIS — Z3A35 35 weeks gestation of pregnancy: Secondary | ICD-10-CM | POA: Diagnosis not present

## 2021-07-19 DIAGNOSIS — O365930 Maternal care for other known or suspected poor fetal growth, third trimester, not applicable or unspecified: Secondary | ICD-10-CM | POA: Diagnosis not present

## 2021-07-26 ENCOUNTER — Ambulatory Visit: Payer: BC Managed Care – PPO | Admitting: *Deleted

## 2021-07-26 ENCOUNTER — Ambulatory Visit: Payer: BC Managed Care – PPO | Attending: Obstetrics

## 2021-07-26 ENCOUNTER — Encounter: Payer: Self-pay | Admitting: *Deleted

## 2021-07-26 VITALS — BP 119/62 | HR 74

## 2021-07-26 DIAGNOSIS — Z3A36 36 weeks gestation of pregnancy: Secondary | ICD-10-CM | POA: Diagnosis not present

## 2021-07-26 DIAGNOSIS — O365930 Maternal care for other known or suspected poor fetal growth, third trimester, not applicable or unspecified: Secondary | ICD-10-CM | POA: Insufficient documentation

## 2021-07-27 DIAGNOSIS — Z3480 Encounter for supervision of other normal pregnancy, unspecified trimester: Secondary | ICD-10-CM | POA: Diagnosis not present

## 2021-07-27 DIAGNOSIS — Z369 Encounter for antenatal screening, unspecified: Secondary | ICD-10-CM | POA: Diagnosis not present

## 2021-07-27 LAB — OB RESULTS CONSOLE GBS: GBS: NEGATIVE

## 2021-07-29 ENCOUNTER — Other Ambulatory Visit: Payer: Self-pay | Admitting: Obstetrics and Gynecology

## 2021-07-29 DIAGNOSIS — Z01818 Encounter for other preprocedural examination: Secondary | ICD-10-CM

## 2021-08-02 ENCOUNTER — Ambulatory Visit: Payer: BC Managed Care – PPO | Admitting: *Deleted

## 2021-08-02 ENCOUNTER — Ambulatory Visit: Payer: BC Managed Care – PPO | Attending: Obstetrics and Gynecology

## 2021-08-02 ENCOUNTER — Other Ambulatory Visit: Payer: Self-pay | Admitting: Obstetrics and Gynecology

## 2021-08-02 VITALS — BP 117/83 | HR 82

## 2021-08-02 DIAGNOSIS — O365930 Maternal care for other known or suspected poor fetal growth, third trimester, not applicable or unspecified: Secondary | ICD-10-CM

## 2021-08-02 DIAGNOSIS — Z3A37 37 weeks gestation of pregnancy: Secondary | ICD-10-CM | POA: Diagnosis not present

## 2021-08-02 NOTE — Procedures (Signed)
Erinn Chap 1990-07-30 [redacted]w[redacted]d Fetus A Non-Stress Test Interpretation for 08/02/21  Indication: IUGR  Fetal Heart Rate A Mode: External Baseline Rate (A): 145 bpm Variability: Moderate Accelerations: 15 x 15 Decelerations: None  Uterine Activity Mode: Palpation, Toco Contraction Frequency (min): None  Interpretation (Fetal Testing) Nonstress Test Interpretation: Reactive Comments: Dr. SDonalee Citrinreviewed tracing.

## 2021-08-04 ENCOUNTER — Telehealth (HOSPITAL_COMMUNITY): Payer: Self-pay | Admitting: *Deleted

## 2021-08-04 ENCOUNTER — Encounter (HOSPITAL_COMMUNITY): Payer: Self-pay

## 2021-08-04 NOTE — Telephone Encounter (Signed)
Preadmission screen  

## 2021-08-05 ENCOUNTER — Inpatient Hospital Stay (HOSPITAL_COMMUNITY): Payer: BC Managed Care – PPO

## 2021-08-05 ENCOUNTER — Encounter: Payer: Self-pay | Admitting: Obstetrics and Gynecology

## 2021-08-06 ENCOUNTER — Inpatient Hospital Stay (HOSPITAL_COMMUNITY): Payer: BC Managed Care – PPO | Admitting: Anesthesiology

## 2021-08-06 ENCOUNTER — Encounter (HOSPITAL_COMMUNITY): Payer: Self-pay | Admitting: Obstetrics and Gynecology

## 2021-08-06 ENCOUNTER — Inpatient Hospital Stay (HOSPITAL_COMMUNITY)
Admission: RE | Admit: 2021-08-06 | Discharge: 2021-08-07 | DRG: 806 | Disposition: A | Discharge status: Home / Self Care | Payer: BC Managed Care – PPO | Attending: Obstetrics and Gynecology | Admitting: Obstetrics and Gynecology

## 2021-08-06 ENCOUNTER — Other Ambulatory Visit: Payer: Self-pay

## 2021-08-06 DIAGNOSIS — Z3A37 37 weeks gestation of pregnancy: Secondary | ICD-10-CM

## 2021-08-06 DIAGNOSIS — O365931 Maternal care for other known or suspected poor fetal growth, third trimester, fetus 1: Secondary | ICD-10-CM | POA: Diagnosis not present

## 2021-08-06 DIAGNOSIS — O365930 Maternal care for other known or suspected poor fetal growth, third trimester, not applicable or unspecified: Principal | ICD-10-CM | POA: Diagnosis present

## 2021-08-06 DIAGNOSIS — D649 Anemia, unspecified: Secondary | ICD-10-CM | POA: Diagnosis not present

## 2021-08-06 DIAGNOSIS — O365990 Maternal care for other known or suspected poor fetal growth, unspecified trimester, not applicable or unspecified: Principal | ICD-10-CM | POA: Diagnosis present

## 2021-08-06 DIAGNOSIS — O26893 Other specified pregnancy related conditions, third trimester: Secondary | ICD-10-CM | POA: Diagnosis not present

## 2021-08-06 DIAGNOSIS — O9081 Anemia of the puerperium: Secondary | ICD-10-CM | POA: Diagnosis not present

## 2021-08-06 DIAGNOSIS — Z8481 Family history of carrier of genetic disease: Secondary | ICD-10-CM

## 2021-08-06 DIAGNOSIS — D62 Acute posthemorrhagic anemia: Secondary | ICD-10-CM | POA: Diagnosis not present

## 2021-08-06 DIAGNOSIS — Z01818 Encounter for other preprocedural examination: Secondary | ICD-10-CM

## 2021-08-06 DIAGNOSIS — O9902 Anemia complicating childbirth: Secondary | ICD-10-CM | POA: Diagnosis not present

## 2021-08-06 LAB — CBC
HCT: 30.5 % — ABNORMAL LOW (ref 36.0–46.0)
Hemoglobin: 10 g/dL — ABNORMAL LOW (ref 12.0–15.0)
MCH: 25.9 pg — ABNORMAL LOW (ref 26.0–34.0)
MCHC: 32.8 g/dL (ref 30.0–36.0)
MCV: 79 fL — ABNORMAL LOW (ref 80.0–100.0)
Platelets: 288 10*3/uL (ref 150–400)
RBC: 3.86 MIL/uL — ABNORMAL LOW (ref 3.87–5.11)
RDW: 13.8 % (ref 11.5–15.5)
WBC: 9.4 10*3/uL (ref 4.0–10.5)
nRBC: 0 % (ref 0.0–0.2)

## 2021-08-06 LAB — TYPE AND SCREEN
ABO/RH(D): A POS
Antibody Screen: NEGATIVE

## 2021-08-06 LAB — RPR: RPR Ser Ql: NONREACTIVE

## 2021-08-06 MED ORDER — LACTATED RINGERS IV SOLN: 500.0000 mL | INTRAVENOUS | Status: DC | PRN

## 2021-08-06 MED ORDER — OXYCODONE-ACETAMINOPHEN 5-325 MG PO TABS: 2.0000 | ORAL_TABLET | ORAL | Status: DC | PRN

## 2021-08-06 MED ORDER — SOD CITRATE-CITRIC ACID 500-334 MG/5ML PO SOLN
30.0000 mL | ORAL | Status: DC | PRN
  Administered 2021-08-06: 30 mL via ORAL
  Filled 2021-08-06: qty 30

## 2021-08-06 MED ORDER — LIDOCAINE-EPINEPHRINE (PF) 1.5 %-1:200000 IJ SOLN
INTRAMUSCULAR | Status: DC | PRN
  Administered 2021-08-06: 3 mL via EPIDURAL
  Administered 2021-08-06: 2 mL via EPIDURAL

## 2021-08-06 MED ORDER — BENZOCAINE-MENTHOL 20-0.5 % EX AERO
1.0000 | INHALATION_SPRAY | CUTANEOUS | Status: DC | PRN
  Administered 2021-08-06: 1 via TOPICAL
  Filled 2021-08-06: qty 56

## 2021-08-06 MED ORDER — EPHEDRINE 5 MG/ML INJ: 10.0000 mg | INTRAVENOUS | Status: DC | PRN

## 2021-08-06 MED ORDER — DIBUCAINE (PERIANAL) 1 % EX OINT: 1.0000 | TOPICAL_OINTMENT | CUTANEOUS | Status: DC | PRN

## 2021-08-06 MED ORDER — OXYCODONE-ACETAMINOPHEN 5-325 MG PO TABS: 1.0000 | ORAL_TABLET | ORAL | Status: DC | PRN

## 2021-08-06 MED ORDER — FENTANYL-BUPIVACAINE-NACL 0.5-0.125-0.9 MG/250ML-% EP SOLN
12.0000 mL/h | EPIDURAL | Status: DC | PRN
  Administered 2021-08-06: 10 mL/h via EPIDURAL
  Filled 2021-08-06: qty 250

## 2021-08-06 MED ORDER — FLEET ENEMA 7-19 GM/118ML RE ENEM: 1.0000 | ENEMA | RECTAL | Status: DC | PRN

## 2021-08-06 MED ORDER — DIPHENHYDRAMINE HCL 50 MG/ML IJ SOLN: 12.5000 mg | INTRAMUSCULAR | Status: DC | PRN

## 2021-08-06 MED ORDER — IBUPROFEN 600 MG PO TABS
600.0000 mg | ORAL_TABLET | Freq: Four times a day (QID) | ORAL | Status: DC
Start: 2021-08-06 — End: 2021-08-07
  Administered 2021-08-06 – 2021-08-07 (×5): 600 mg via ORAL
  Filled 2021-08-06 (×5): qty 1

## 2021-08-06 MED ORDER — LACTATED RINGERS IV SOLN: 500.0000 mL | Freq: Once | INTRAVENOUS | Status: DC

## 2021-08-06 MED ORDER — TERBUTALINE SULFATE 1 MG/ML IJ SOLN: 0.2500 mg | Freq: Once | INTRAMUSCULAR | Status: DC | PRN

## 2021-08-06 MED ORDER — SIMETHICONE 80 MG PO CHEW: 80.0000 mg | CHEWABLE_TABLET | ORAL | Status: DC | PRN

## 2021-08-06 MED ORDER — SENNOSIDES-DOCUSATE SODIUM 8.6-50 MG PO TABS
2.0000 | ORAL_TABLET | Freq: Every day | ORAL | Status: DC
  Administered 2021-08-07: 2 via ORAL
  Filled 2021-08-06: qty 2

## 2021-08-06 MED ORDER — ONDANSETRON HCL 4 MG/2ML IJ SOLN: 4.0000 mg | Freq: Four times a day (QID) | INTRAMUSCULAR | Status: DC | PRN

## 2021-08-06 MED ORDER — PHENYLEPHRINE 80 MCG/ML (10ML) SYRINGE FOR IV PUSH (FOR BLOOD PRESSURE SUPPORT): 80.0000 ug | PREFILLED_SYRINGE | INTRAVENOUS | Status: DC | PRN

## 2021-08-06 MED ORDER — ZOLPIDEM TARTRATE 5 MG PO TABS: 5.0000 mg | ORAL_TABLET | Freq: Every evening | ORAL | Status: DC | PRN

## 2021-08-06 MED ORDER — LIDOCAINE HCL (PF) 1 % IJ SOLN: 30.0000 mL | INTRAMUSCULAR | Status: DC | PRN

## 2021-08-06 MED ORDER — LACTATED RINGERS IV SOLN: INTRAVENOUS | Status: DC

## 2021-08-06 MED ORDER — OXYTOCIN-SODIUM CHLORIDE 30-0.9 UT/500ML-% IV SOLN
2.5000 [IU]/h | INTRAVENOUS | Status: DC
  Filled 2021-08-06: qty 500

## 2021-08-06 MED ORDER — OXYTOCIN-SODIUM CHLORIDE 30-0.9 UT/500ML-% IV SOLN
1.0000 m[IU]/min | INTRAVENOUS | Status: DC
  Administered 2021-08-06: 2 m[IU]/min via INTRAVENOUS

## 2021-08-06 MED ORDER — DIPHENHYDRAMINE HCL 25 MG PO CAPS: 25.0000 mg | ORAL_CAPSULE | Freq: Four times a day (QID) | ORAL | Status: DC | PRN

## 2021-08-06 MED ORDER — ONDANSETRON HCL 4 MG/2ML IJ SOLN: 4.0000 mg | INTRAMUSCULAR | Status: DC | PRN

## 2021-08-06 MED ORDER — WITCH HAZEL-GLYCERIN EX PADS: 1.0000 | MEDICATED_PAD | CUTANEOUS | Status: DC | PRN

## 2021-08-06 MED ORDER — PRENATAL MULTIVITAMIN CH
1.0000 | ORAL_TABLET | Freq: Every day | ORAL | Status: DC
  Administered 2021-08-06 – 2021-08-07 (×2): 1 via ORAL
  Filled 2021-08-06 (×2): qty 1

## 2021-08-06 MED ORDER — ACETAMINOPHEN 325 MG PO TABS
650.0000 mg | ORAL_TABLET | ORAL | Status: DC | PRN
  Administered 2021-08-06 (×2): 650 mg via ORAL
  Filled 2021-08-06 (×2): qty 2

## 2021-08-06 MED ORDER — COCONUT OIL OIL: 1.0000 | TOPICAL_OIL | Status: DC | PRN

## 2021-08-06 MED ORDER — TETANUS-DIPHTH-ACELL PERTUSSIS 5-2.5-18.5 LF-MCG/0.5 IM SUSY: 0.5000 mL | PREFILLED_SYRINGE | Freq: Once | INTRAMUSCULAR | Status: DC

## 2021-08-06 MED ORDER — OXYTOCIN BOLUS FROM INFUSION
333.0000 mL | Freq: Once | INTRAVENOUS | Status: AC
  Administered 2021-08-06: 333 mL via INTRAVENOUS

## 2021-08-06 MED ORDER — ONDANSETRON HCL 4 MG PO TABS
4.0000 mg | ORAL_TABLET | ORAL | Status: DC | PRN
  Administered 2021-08-06: 4 mg via ORAL
  Filled 2021-08-06: qty 1

## 2021-08-06 MED ORDER — ACETAMINOPHEN 325 MG PO TABS: 650.0000 mg | ORAL_TABLET | ORAL | Status: DC | PRN

## 2021-08-06 NOTE — Anesthesia Preprocedure Evaluation (Signed)
Anesthesia Evaluation  Patient identified by MRN, date of birth, ID band Patient awake    Reviewed: Allergy & Precautions, H&P , Patient's Chart, lab work & pertinent test results  Airway Mallampati: II  TM Distance: >3 FB Neck ROM: full    Dental  (+) Teeth Intact   Pulmonary    breath sounds clear to auscultation       Cardiovascular  Rhythm:regular Rate:Normal     Neuro/Psych PSYCHIATRIC DISORDERS Anxiety Depression Bipolar Disorder    GI/Hepatic   Endo/Other    Renal/GU      Musculoskeletal   Abdominal   Peds  Hematology  (+) Blood dyscrasia, anemia ,   Anesthesia Other Findings       Reproductive/Obstetrics (+) Pregnancy                             Anesthesia Physical  Anesthesia Plan  ASA: 2  Anesthesia Plan: Epidural   Post-op Pain Management:    Induction:   PONV Risk Score and Plan:   Airway Management Planned:   Additional Equipment:   Intra-op Plan:   Post-operative Plan:   Informed Consent: I have reviewed the patients History and Physical, chart, labs and discussed the procedure including the risks, benefits and alternatives for the proposed anesthesia with the patient or authorized representative who has indicated his/her understanding and acceptance.       Plan Discussed with:   Anesthesia Plan Comments:         Anesthesia Quick Evaluation

## 2021-08-06 NOTE — Progress Notes (Signed)
Calle dby RN to notify of clear SROM with CE now 8-9cm. Anticipate SVD BP (!) 87/41   Pulse 73   Temp 98.2 F (36.8 C) (Oral)   Resp 16   Ht '5\' 1"'$  (1.549 m)   Wt 73.1 kg   LMP 11/16/2020   SpO2 99%   BMI 30.46 kg/m

## 2021-08-06 NOTE — Anesthesia Procedure Notes (Signed)
Epidural Patient location during procedure: OB Start time: 08/06/2021 4:29 AM End time: 08/06/2021 4:50 AM  Staffing Anesthesiologist: Nolon Nations, MD Performed: anesthesiologist   Preanesthetic Checklist Completed: patient identified, IV checked, risks and benefits discussed, monitors and equipment checked, pre-op evaluation and timeout performed  Epidural Patient position: sitting Prep: DuraPrep and site prepped and draped Patient monitoring: heart rate, continuous pulse ox and blood pressure Approach: midline Location: L2-L3 Injection technique: LOR air and LOR saline  Needle:  Needle type: Tuohy  Needle gauge: 17 G Needle length: 9 cm Needle insertion depth: 5 cm Catheter type: closed end flexible Catheter size: 19 Gauge Catheter at skin depth: 11 cm Test dose: negative  Assessment Sensory level: T8 Events: blood not aspirated, injection not painful, no injection resistance, no paresthesia and negative IV test  Additional Notes Reason for block:procedure for pain

## 2021-08-06 NOTE — Assessment & Plan Note (Signed)
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

## 2021-08-06 NOTE — H&P (Signed)
Casey Dennis is a 31 y.o. female presenting for scheduled IOL. +FM, denies VB, LOF, int ctx. Late admission 2/2 full L&D floor  PNC c/b 1) IUGR - 7/3 MFM scan: EFW 2.8% (4lb5oz =1966g), BPP 8/8, AFI 11.16cm, normal dopplers, vtx. 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 Social History:  reports that she has never smoked. She has never used smokeless tobacco. She reports that she does not currently use alcohol after a past usage of about 1.0 standard drink of alcohol per week. She reports that she does not currently use drugs after having used the following drugs: Marijuana.     Maternal Diabetes: No 1hr 139, 3hr WNL 0 of 4 Genetic Screening: Normal Maternal Ultrasounds/Referrals: IUGR Fetal Ultrasounds or other Referrals:  None Maternal Substance Abuse:  No Significant Maternal Medications:  None Significant Maternal Lab Results:  Group B Strep negative Other Comments:  None  Review of Systems  Constitutional:  Negative for chills and fever.  Respiratory:  Negative for shortness of breath.   Cardiovascular:  Negative for chest pain, palpitations and leg swelling.  Gastrointestinal:  Negative for abdominal pain and vomiting.  Neurological:  Negative for dizziness, weakness and headaches.  Psychiatric/Behavioral:  Negative for suicidal ideas.    History Dilation: 3 Effacement (%): 60 Station: -2 Exam by:: m hopkins rn Blood pressure (!) 105/56, pulse 81, temperature 98.1 F (36.7 C),  temperature source Oral, resp. rate 16, height '5\' 1"'$  (1.549 m), weight 73.1 kg, last menstrual period 11/16/2020, SpO2 99 %, unknown if currently breastfeeding. Exam Physical Exam Constitutional:      General: She is not in acute distress.    Appearance: She is well-developed.  HENT:     Head: Normocephalic and atraumatic.  Eyes:     Pupils: Pupils are equal, round, and reactive to light.  Cardiovascular:     Rate and Rhythm: Normal rate and regular rhythm.     Heart sounds: No murmur heard.    No gallop.  Abdominal:     Tenderness: There is no abdominal tenderness. There is no guarding or rebound.  Genitourinary:    Vagina: Normal.  Musculoskeletal:        General: Normal range of motion.     Cervical back: Normal range of motion and neck supple.  Skin:    General: Skin is warm and dry.  Neurological:     Mental Status: She is alert and oriented to person, place, and time.     Prenatal labs: ABO, Rh: --/--/A POS (07/21 0100) Antibody: NEG (07/21 0100) Rubella: Nonimmune (01/16 0000) RPR: Nonreactive (01/16 0000)  HBsAg: Negative (01/16 0000)  HIV: Non-reactive (01/16 0000)  GBS: Negative/-- (07/11 0000)  Assessment/Plan: This is a 31yo [id number] @ 37 4/7 by TVUS NOT c/w LMP admitted for IOL for IUGR; Doppler studies WNL> GBS neg, pelvis proven to 6lb1oz. S/p epidural, Cat 1 tracing, TOCO q2-64mon 10 of pitocin. Plan for AROM once patient's husband has returned from home, anticipate SVD   SDeliah Boston7/21/2023, 8:02 AM

## 2021-08-07 LAB — CBC
HCT: 26.4 % — ABNORMAL LOW (ref 36.0–46.0)
Hemoglobin: 8.5 g/dL — ABNORMAL LOW (ref 12.0–15.0)
MCH: 25.6 pg — ABNORMAL LOW (ref 26.0–34.0)
MCHC: 32.2 g/dL (ref 30.0–36.0)
MCV: 79.5 fL — ABNORMAL LOW (ref 80.0–100.0)
Platelets: 226 10*3/uL (ref 150–400)
RBC: 3.32 MIL/uL — ABNORMAL LOW (ref 3.87–5.11)
RDW: 13.8 % (ref 11.5–15.5)
WBC: 10.2 10*3/uL (ref 4.0–10.5)
nRBC: 0 % (ref 0.0–0.2)

## 2021-08-07 NOTE — Discharge Summary (Signed)
Postpartum Discharge Summary   Patient Name: Casey Dennis DOB: 11-Apr-1990 MRN: [id number]  Date of admission: 08/06/2021 Delivery date:08/06/2021  Delivering provider: Deliah Boston  Date of discharge: 08/07/2021  Admitting diagnosis: IUGR (intrauterine growth restriction) affecting care of mother [O36.5990] Intrauterine pregnancy: [redacted]w[redacted]d     Secondary diagnosis:  Principal Problem:   IUGR (intrauterine growth restriction) affecting care of mother  Additional problems: none    Discharge diagnosis: Term Pregnancy Delivered and Anemia        HbE trait and acute blood loss                                 Post partum procedures: none Augmentation: Pitocin Complications: None  Hospital course: Induction of Labor With Vaginal Delivery   31 y.o. yo [id number] at [redacted]w[redacted]d was admitted to the hospital 08/06/2021 for induction of labor.  Indication for induction:  IUGR .  Patient had an uncomplicated labor course as follows: Membrane Rupture Time/Date: 8:20 AM ,08/06/2021   Delivery Method:Vaginal, Spontaneous  Episiotomy: None  Lacerations:  Periurethral  Details of delivery can be found in separate delivery note.  Patient had a routine postpartum course. Patient is discharged home 08/07/21.  Newborn Data: Birth date:08/06/2021  Birth time:9:12 AM  Gender:Female  Living status:Living  Apgars:9 ,9  Weight:2450 g   Magnesium Sulfate received: No BMZ received: No Rhophylac:N/A MMR:N/A T-DaP: see office note Flu: N/A Transfusion:No  Physical exam  Vitals:   08/06/21 1600 08/06/21 2200 08/06/21 2344 08/07/21 0401  BP: (!) 110/46 (!) 90/51 (!) 97/43 (!) 90/51  Pulse: 72 67 66 65  Resp: $Remo'16 18 18 18  'gMMzm$ Temp: 98.6 F (37 C) 98.7 F (37.1 C) 98.2 F (36.8 C) (!) 97.3 F (36.3 C)  TempSrc: Oral Oral Oral Oral  SpO2: 99%     Weight:      Height:       General: alert, cooperative, and no distress Lochia: appropriate Uterine Fundus: firm Incision: N/A DVT Evaluation: No evidence of  DVT seen on physical exam. Labs: Lab Results  Component Value Date   WBC 10.2 08/07/2021   HGB 8.5 (L) 08/07/2021   HCT 26.4 (L) 08/07/2021   MCV 79.5 (L) 08/07/2021   PLT 226 08/07/2021      Latest Ref Rng & Units 04/14/2020    4:37 AM  CMP  Glucose 70 - 99 mg/dL 104   BUN 6 - 20 mg/dL 11   Creatinine 0.44 - 1.00 mg/dL 0.65   Sodium 135 - 145 mmol/L 135   Potassium 3.5 - 5.1 mmol/L 3.9   Chloride 98 - 111 mmol/L 104   CO2 22 - 32 mmol/L 25   Calcium 8.9 - 10.3 mg/dL 9.3   Total Protein 6.5 - 8.1 g/dL 7.2   Total Bilirubin 0.3 - 1.2 mg/dL 0.5   Alkaline Phos 38 - 126 U/L 37   AST 15 - 41 U/L 18   ALT 0 - 44 U/L 14    Edinburgh Score:    08/07/2021    5:56 AM  Edinburgh Postnatal Depression Scale Screening Tool  I have been able to laugh and see the funny side of things. 0  I have looked forward with enjoyment to things. 0  I have blamed myself unnecessarily when things went wrong. 0  I have been anxious or worried for no good reason. 0  I have felt scared or panicky for no  good reason. 0  Things have been getting on top of me. 0  I have been so unhappy that I have had difficulty sleeping. 0  I have felt sad or miserable. 0  I have been so unhappy that I have been crying. 0  The thought of harming myself has occurred to me. 0  Edinburgh Postnatal Depression Scale Total 0      After visit meds:  Allergies as of 08/07/2021   No Known Allergies      Medication List     STOP taking these medications    FLUoxetine 20 MG capsule Commonly known as: PROZAC       TAKE these medications    ferrous sulfate 325 (65 FE) MG tablet Take 325 mg by mouth daily with breakfast.   prenatal vitamin w/FE, FA 27-1 MG Tabs tablet Take 1 tablet by mouth daily at 12 noon.         Discharge home in stable condition Infant Feeding:  see peds note Infant Disposition:home with mother Discharge instruction: per After Visit Summary and Postpartum booklet. Activity:  Advance as tolerated. Pelvic rest for 6 weeks.  Diet: routine diet Anticipated Birth Control: Unsure Postpartum Appointment:4 weeks Additional Postpartum F/U: Postpartum Depression checkup Future Appointments:No future appointments. Follow up Visit:  Follow-up Information     Ob/Gyn, [facility]. Call in 4 week(s).   Contact information: [place] [address] [phone]                     08/07/2021 Allyn Kenner, DO

## 2021-08-07 NOTE — Anesthesia Postprocedure Evaluation (Signed)
Anesthesia Post Note  Patient: Casey Dennis  Procedure(s) Performed: AN AD [place]     Patient location during evaluation: Mother Baby Anesthesia Type: Epidural Level of consciousness: awake and alert Pain management: pain level controlled Vital Signs Assessment: post-procedure vital signs reviewed and stable Respiratory status: spontaneous breathing, nonlabored ventilation and respiratory function stable Cardiovascular status: stable Postop Assessment: no headache, no backache and epidural receding Anesthetic complications: no   No notable events documented.  Last Vitals:  Vitals:   08/06/21 2344 08/07/21 0401  BP: (!) 97/43 (!) 90/51  Pulse: 66 65  Resp: 18 18  Temp: 36.8 C (!) 36.3 C  SpO2:      Last Pain:  Vitals:   08/07/21 0401  TempSrc: Oral  PainSc:    Pain Goal: Patients Stated Pain Goal: 3 (08/06/21 1753)                 [name]

## 2021-08-07 NOTE — Clinical Social Work Maternal (Signed)
CLINICAL SOCIAL WORK MATERNAL/CHILD NOTE  Patient Details  Name: Casey Dennis MRN: [id number] Date of Birth: 08/03/1990  Date:  08/07/2021  Clinical Social Worker Initiating Note:  Idamae Lusher Date/Time: Initiated:  08/07/21/1123     Child's Name:  Casey Dennis Parents:  Mother, Father (MOB: Casey Dennis DOB: 1989[birth year]23 FOB: Casey Dennis DOB: 06/08/1986)   Need for Interpreter:  None   Reason for Referral:  Behavioral Health Concerns   Address:  [place] [address]    Phone number:  [phone] (home)     Additional phone number:   Household Members/Support Persons (HM/SP):   Household Member/Support Person 1, Household Member/Support Person 2   HM/SP Name Relationship DOB or Age  HM/SP -1 Y'Reuben Buonya Spouse/FOB 06/08/1986  HM/SP -2 [name] Daughter 06/24/2014 (7)  HM/SP -3        HM/SP -4        HM/SP -5        HM/SP -6        HM/SP -7        HM/SP -8          Natural Supports (not living in the home):  Immediate Family   Professional Supports: None   Employment: Part-time   Type of Work: Economist   Education:  Nurse, adult   Homebound arranged:    Museum/gallery curator Resources:  Multimedia programmer    Other Resources:  [employer] (Biomedical scientist provided)   Cultural/Religious Considerations Which May Impact Care:  None identified  Strengths:  Ability to meet basic needs  , Engineer, materials, Home prepared for child     Psychotropic Medications:         Pediatrician:    [place]olicitor area  Pediatrician List:   [facility]  [place]      Pediatrician Fax Number:    Risk Factors/Current Problems:  Mental Health Concerns     Cognitive State:  Able to Concentrate  , Alert  , Goal Oriented  , Linear Thinking     Mood/Affect:  Calm  , Relaxed  , Comfortable   , Interested     CSW Assessment: CSW received consult for "Mom Bipolar." When CSW entered room, FOB and their 7-year-old daughter were present. CSW requested privacy. FOB was agreeable and he and child left room. CSW introduced self and reason for consult. MOB presented as polite and engaged throughout consult.  CSW inquired about MOB's mental health history. MOB reports she does not agree with a diagnosis of bipolar disorder. MOB denies a history of mania. MOB reports she did experience symptoms of depression and anxiety after her father died "a couple years ago" and primarily attributes past mental health symptoms to experiencing grief. MOB reports she has experienced depression and anxiety symptoms from "time to time" in the past aside from the death of her father but denies experiencing any mental health symptoms during pregnancy or since giving birth. Per chart review, MOB was hospitalized inpatient for mental health concerns, marked by experiencing command auditory hallucinations and suicidal ideation. However, MOB did not bring this up during consult. MOB reports she used to take fluoxetine but stopped taking the medication prior to becoming pregnant. MOB reports she has tried therapy in the past but did not like it. MOB reports she has good support. MOB was not able to  identify coping skills when asked. CSW inquired if MOB experienced postpartum depression after giving birth to her daughter. MOB reports she "maybe" experienced PPD but reports this pregnancy and labor experience is "totally different." MOB recalls that her hormones felt "out of wack" during her first pregnancy and postpartum experience. MOB also shares she believes she experienced anxiety postpartum regarding feeling afraid her daughter would be kidnapped. CSW validated MOB's emotions and provided education regarding the difference between worries as a new mom and PMADs, encouraging MOB to contact her medical provider if she begins to  experience PMAD symptoms. CSW inquired how MOB has felt emotionally since giving birth. MOB reports she feels "fine, normal" and shares she does not feel concerned about experiencing postpartum depression at this time. MOB denied current SI/HI/DV.   MOB reports she has all needed baby items including a car seat and bassinet. MOB has chosen [facility] as infant's pediatrician. MOB reports she receives [facility]. MOB states she does not receive Food Stamps but is interested in applying. CSW provided MOB with a paper application per MOB's request. MOB declined additional resource needs.  CSW provided education regarding the baby blues period vs. perinatal mood disorders, discussed treatment and gave resources for mental health follow up if concerns arise.  CSW recommends self-evaluation during the postpartum time period using the New Mom Checklist from Postpartum Progress and encouraged MOB to contact a medical professional if symptoms are noted at any time.    CSW provided review of Sudden Infant Death Syndrome (SIDS) precautions.    CSW identifies no further need for intervention and no barriers to discharge at this time.   CSW Plan/Description:  No Further Intervention Required/No Barriers to Discharge, Sudden Infant Death Syndrome (SIDS) Education, Other Information/Referral to [employer], Perinatal Mood and Anxiety Disorder (PMADs) Education    Berniece Salines, [place] 08/07/2021, 11:40 AM

## 2021-08-10 LAB — SURGICAL PATHOLOGY

## 2021-08-13 ENCOUNTER — Telehealth (HOSPITAL_COMMUNITY): Payer: Self-pay

## 2021-08-13 NOTE — Telephone Encounter (Signed)
Patient reports feeling good. Patient declines questions/concerns about her health and healing.  Patient reports that baby is doing good. Eating, peeing/pooping, and gaining weight. Baby sleeps in a bassinet. RN reviewed ABC's of safe sleep with patient. Patient declines any questions or concerns about baby.  EPDS score is 3.  Sharyn Lull [facility]  08/13/21,1732

## 2021-08-18 DIAGNOSIS — R102 Pelvic and perineal pain: Secondary | ICD-10-CM | POA: Diagnosis not present

## 2021-10-25 DIAGNOSIS — Z30430 Encounter for insertion of intrauterine contraceptive device: Secondary | ICD-10-CM | POA: Diagnosis not present

## 2021-10-25 DIAGNOSIS — Z113 Encounter for screening for infections with a predominantly sexual mode of transmission: Secondary | ICD-10-CM | POA: Diagnosis not present

## 2021-10-25 DIAGNOSIS — Z3202 Encounter for pregnancy test, result negative: Secondary | ICD-10-CM | POA: Diagnosis not present

## 2021-10-25 DIAGNOSIS — Z6827 Body mass index (BMI) 27.0-27.9, adult: Secondary | ICD-10-CM | POA: Diagnosis not present

## 2021-11-23 DIAGNOSIS — Z6827 Body mass index (BMI) 27.0-27.9, adult: Secondary | ICD-10-CM | POA: Diagnosis not present

## 2021-11-23 DIAGNOSIS — Z30431 Encounter for routine checking of intrauterine contraceptive device: Secondary | ICD-10-CM | POA: Diagnosis not present

## 2022-04-20 NOTE — Progress Notes (Deleted)
  Subjective:    Casey Dennis - 32 y.o. female MRN [id number]  Date of birth: September 09, 1990  HPI  Casey Dennis is to establish care.   Current issues and/or concerns: Please note - Vaginal delivery 08/06/2021. There is no record of post partum visit. We will need to confirm this.   ROS per HPI     Health Maintenance:  Health Maintenance Due  Topic Date Due   COVID-19 Vaccine (1) Never done   DTaP/Tdap/Td (1 - Tdap) Never done     Past Medical History: Patient Active Problem List   Diagnosis Date Noted   IUGR (intrauterine growth restriction) affecting care of mother [id number]   Complicated grief [id number]   Bipolar disorder, unspecified 08/25/2017      Social History   reports that she has never smoked. She has never used smokeless tobacco. She reports that she does not currently use alcohol after a past usage of about 1.0 standard drink of alcohol per week. She reports that she does not currently use drugs after having used the following drugs: Marijuana.   Family History  family history includes Cancer in her father; Diabetes in her mother; Mental illness in her brother and sister.   Medications: reviewed and updated   Objective:   Physical Exam There were no vitals taken for this visit. Physical Exam      Assessment & Plan:         Patient was given clear instructions to go to Emergency Department or return to medical center if symptoms don't improve, worsen, or new problems develop.The patient verbalized understanding.  I discussed the assessment and treatment plan with the patient. The patient was provided an opportunity to ask questions and all were answered. The patient agreed with the plan and demonstrated an understanding of the instructions.   The patient was advised to call back or seek an in-person evaluation if the symptoms worsen or if the condition fails to improve as anticipated.    Durene Fruits, NP 04/20/2022, 10:03 AM Primary Care at [facility]

## 2022-04-26 ENCOUNTER — Ambulatory Visit: Payer: BC Managed Care – PPO | Admitting: Family

## 2022-04-26 DIAGNOSIS — Z7689 Persons encountering health services in other specified circumstances: Secondary | ICD-10-CM

## 2022-07-14 ENCOUNTER — Encounter: Payer: BC Managed Care – PPO | Admitting: Family

## 2022-07-14 NOTE — Progress Notes (Signed)
Erroneous encounter-disregard

## 2022-09-13 ENCOUNTER — Other Ambulatory Visit: Payer: Self-pay

## 2022-09-13 ENCOUNTER — Encounter (HOSPITAL_COMMUNITY): Payer: Self-pay

## 2022-09-13 ENCOUNTER — Emergency Department (HOSPITAL_COMMUNITY)
Admission: EM | Admit: 2022-09-13 | Discharge: 2022-09-13 | Disposition: A | Discharge status: Home / Self Care | Payer: BC Managed Care – PPO | Attending: Emergency Medicine | Admitting: Emergency Medicine

## 2022-09-13 ENCOUNTER — Emergency Department (HOSPITAL_COMMUNITY): Payer: BC Managed Care – PPO

## 2022-09-13 DIAGNOSIS — R42 Dizziness and giddiness: Secondary | ICD-10-CM | POA: Diagnosis not present

## 2022-09-13 DIAGNOSIS — R202 Paresthesia of skin: Secondary | ICD-10-CM | POA: Diagnosis not present

## 2022-09-13 DIAGNOSIS — R209 Unspecified disturbances of skin sensation: Secondary | ICD-10-CM | POA: Diagnosis not present

## 2022-09-13 DIAGNOSIS — J320 Chronic maxillary sinusitis: Secondary | ICD-10-CM | POA: Diagnosis not present

## 2022-09-13 DIAGNOSIS — R29818 Other symptoms and signs involving the nervous system: Secondary | ICD-10-CM | POA: Diagnosis not present

## 2022-09-13 DIAGNOSIS — R200 Anesthesia of skin: Secondary | ICD-10-CM | POA: Diagnosis not present

## 2022-09-13 LAB — PROTIME-INR
INR: 1 (ref 0.8–1.2)
Prothrombin Time: 13.7 seconds (ref 11.4–15.2)

## 2022-09-13 LAB — CBG MONITORING, ED: Glucose-Capillary: 114 mg/dL — ABNORMAL HIGH (ref 70–99)

## 2022-09-13 LAB — APTT: aPTT: 29 seconds (ref 24–36)

## 2022-09-13 LAB — COMPREHENSIVE METABOLIC PANEL
ALT: 17 U/L (ref 0–44)
AST: 18 U/L (ref 15–41)
Albumin: 4.2 g/dL (ref 3.5–5.0)
Alkaline Phosphatase: 53 U/L (ref 38–126)
Anion gap: 16 — ABNORMAL HIGH (ref 5–15)
BUN: 8 mg/dL (ref 6–20)
CO2: 23 mmol/L (ref 22–32)
Calcium: 9.8 mg/dL (ref 8.9–10.3)
Chloride: 101 mmol/L (ref 98–111)
Creatinine, Ser: 0.79 mg/dL (ref 0.44–1.00)
GFR, Estimated: >60 mL/min (ref >60)
Glucose, Bld: 113 mg/dL — ABNORMAL HIGH (ref 70–99)
Potassium: 3.6 mmol/L (ref 3.5–5.1)
Sodium: 140 mmol/L (ref 135–145)
Total Bilirubin: 0.7 mg/dL (ref 0.3–1.2)
Total Protein: 8 g/dL (ref 6.5–8.1)

## 2022-09-13 LAB — DIFFERENTIAL
Abs Immature Granulocytes: 0.02 10*3/uL (ref 0.00–0.07)
Basophils Absolute: 0.1 10*3/uL (ref 0.0–0.1)
Basophils Relative: 1 %
Eosinophils Absolute: 0 10*3/uL (ref 0.0–0.5)
Eosinophils Relative: 0 %
Immature Granulocytes: 0 %
Lymphocytes Relative: 21 %
Lymphs Abs: 1.6 10*3/uL (ref 0.7–4.0)
Monocytes Absolute: 0.5 10*3/uL (ref 0.1–1.0)
Monocytes Relative: 6 %
Neutro Abs: 5.7 10*3/uL (ref 1.7–7.7)
Neutrophils Relative %: 72 %

## 2022-09-13 LAB — CBC
HCT: 39.1 % (ref 36.0–46.0)
Hemoglobin: 12.6 g/dL (ref 12.0–15.0)
MCH: 26.2 pg (ref 26.0–34.0)
MCHC: 32.2 g/dL (ref 30.0–36.0)
MCV: 81.3 fL (ref 80.0–100.0)
Platelets: 328 10*3/uL (ref 150–400)
RBC: 4.81 MIL/uL (ref 3.87–5.11)
RDW: 12.7 % (ref 11.5–15.5)
WBC: 7.9 10*3/uL (ref 4.0–10.5)
nRBC: 0 % (ref 0.0–0.2)

## 2022-09-13 LAB — ETHANOL: Alcohol, Ethyl (B): <10 mg/dL (ref <10)

## 2022-09-13 LAB — HCG, SERUM, QUALITATIVE: Preg, Serum: NEGATIVE

## 2022-09-13 MED ORDER — GADOBUTROL 1 MMOL/ML IV SOLN
7.0000 mL | Freq: Once | INTRAVENOUS | Status: AC | PRN
  Administered 2022-09-13: 7 mL via INTRAVENOUS

## 2022-09-13 NOTE — Code Documentation (Signed)
Casey Dennis is a 32 yr old female arriving to [facility] on 09/13/2022. Pt is from home where she was LKW at 1300, when she had a sudden onset of dizziness, followed by left face and arm sensory loss. Stroke code activated in triage. Pt with no prior medical history, and on no medications.    Labs, CBG, VS obtained. Pt examined by neurologist. NIHSS 1 for dizziness when walking and left sensory loss. Pt taken to CT with team. CT head performed. Per Dr. Derry Lory, CT negative for acute abnormality.     Pt returned to room 16 where her workup will continue. She will need q 30 min VS and NIHSS until OOW for TNK (1430), then q 2 hr for 12 hrs, then q 4. Bedside handoff to ED RN complete. Pt not TNK candidate as no disabling deficit, not eligible for thrombectomy as exam is LVO negative.

## 2022-09-13 NOTE — ED Notes (Signed)
Patient transported to MRI 

## 2022-09-13 NOTE — ED Triage Notes (Signed)
Patient arrives with complaint of sudden onset of dizziness this morning at 1000. Shortly thereafter had numbness to L hand. Both symptoms still present.

## 2022-09-13 NOTE — ED Provider Notes (Signed)
Signout received on this 32 year old female presented with bilateral facial numbness, as well as left hand numbness and tingling.  No visual change, speech change, or unilateral weakness.  No prior history of CVA.  Case was discussed with neurology who recommended MRI and discussion with neurology after the MRI results for additional recommendations.  See prior note from [facility] for full details. Physical Exam  BP 115/80   Pulse 81   Temp 98.4 F (36.9 C) (Oral)   Resp (!) 21   Ht 5\' 1"  (1.549 m)   Wt 68 kg   LMP 09/04/2022 (Approximate)   SpO2 100%   BMI 28.34 kg/m     Procedures  Procedures  ED Course / MDM   Clinical Course as of 09/13/22 1721  Tue Sep 13, 2022  1622 MR BRAIN W WO CONTRAST [AA]    Clinical Course User Index [AA] Marita Kansas, PA-C   Medical Decision Making Amount and/or Complexity of Data Reviewed Radiology: ordered. Decision-making details documented in ED Course.  Risk Prescription drug management.   MRI resulted.  No acute findings.  Discussed with Dr. Derry Lory with neurology who recommends discharge with outpatient neurology follow-up.  Plan discussed with patient who voices understanding and is in agreement with the plan.       Marita Kansas, PA-C 09/13/22 1722    Laurence Spates, MD 09/14/22 [phone]

## 2022-09-13 NOTE — Discharge Instructions (Signed)
Your MRI and other workup do not show any concerning findings.  Neurology recommends no additional workup in the emergency department and for you to follow-up outpatient with a neurologist.  Neurologist referral given.  They will call you in the next couple days to schedule an appointment if you do not hear from them please give their office a call to schedule an appointment.  Have also listed information for [facility] to establish a PCP.

## 2022-09-13 NOTE — Consult Note (Signed)
NEUROLOGY CONSULTATION NOTE   Date of service: September 13, 2022 Patient Name: Casey Dennis MRN:  [id number] DOB:  04/19/1990 Reason for consult: "CODE STROKE" Requesting Provider: Gloris Manchester, MD _ _ _   _ __   _ __ _ _  __ __   _ __   __ _  History of Present Illness  Casey Dennis is a 32 y.o. female with PMH significant for asthma, anxiety/depression who presents to ED c/o dizziness and left facial numbness since 10 this morning. She states she was driving in her car when this started. Code stroke was called in Triage. Stroke team arrived and assessed in Triage 1.  Patient denies headache, recent illness or vaccinations. No focal weakness seen on exam. NIH 1 for left face/arm numbness.   LKW: 1000 mRS: 0 tNKASE: No, mild symptoms Thrombectomy: No, No LVO suspected NIHSS components Score: Comment  1a Level of Conscious 0[x]  1[]  2[]  3[]      1b LOC Questions 0[x]  1[]  2[]       1c LOC Commands 0[x]  1[]  2[]       2 Best Gaze 0[x]  1[]  2[]       3 Visual 0[x]  1[]  2[]  3[]      4 Facial Palsy 0[x]  1[]  2[]  3[]      5a Motor Arm - left 0[x]  1[]  2[]  3[]  4[]  UN[]    5b Motor Arm - Right 0[x]  1[]  2[]  3[]  4[]  UN[]    6a Motor Leg - Left 0[x]  1[]  2[]  3[]  4[]  UN[]    6b Motor Leg - Right 0[x]  1[]  2[]  3[]  4[]  UN[]    7 Limb Ataxia 0[]  1[]  2[]  3[]  UN[]     8 Sensory 0[]  1[x]  2[]  UN[]      9 Best Language 0[x]  1[]  2[]  3[]      10 Dysarthria 0[x]  1[]  2[]  UN[]      11 Extinct. and Inattention 0[x]  1[]  2[]       TOTAL:    0       ROS   Constitutional Denies weight loss, fever and chills.   HEENT Denies changes in vision and hearing.   Respiratory Denies SOB and cough.   CV Denies palpitations and CP   GI Denies abdominal pain, vomiting and diarrhea. Positive for nausea during gait assessment.   GU Denies dysuria and urinary frequency.   MSK Denies myalgia and joint pain.   Skin Denies rash and pruritus.   Neurological Denies headache and syncope.   Psychiatric Denies recent changes in mood. Hx of anxiety  and depression.    Past History   Past Medical History:  Diagnosis Date   Allergy    Anemia    Anxiety    Depression    Past Surgical History:  Procedure Laterality Date   INDUCED ABORTION     TOOTH EXTRACTION     Family History  Problem Relation Age of Onset   Cancer Father    Diabetes Mother    Mental illness Sister    Mental illness Brother    Social History   Socioeconomic History   Marital status: Married    Spouse name: Not on file   Number of children: 1   Years of education: Not on file   Highest education level: Not on file  Occupational History   Not on file  Tobacco Use   Smoking status: Never   Smokeless tobacco: Never  Vaping Use   Vaping status: Never Used  Substance and Sexual Activity   Alcohol use: Not Currently    Comment: occ  Drug use: Not Currently    Types: Marijuana    Comment: last use fall of 2022   Sexual activity: Yes    Birth control/protection: None  Other Topics Concern   Not on file  Social History Narrative   Not on file   Social Determinants of Health   Financial Resource Strain: Not on file  Food Insecurity: Unknown (08/25/2017)   Hunger Vital Sign    Worried About Running Out of Food in the Last Year: Patient declined    Ran Out of Food in the Last Year: Never true  Transportation Needs: No Transportation Needs (08/25/2017)   PRAPARE - Administrator, Civil Service (Medical): No    Lack of Transportation (Non-Medical): No  Physical Activity: Not on file  Stress: Not on file  Social Connections: Not on file   No Known Allergies  Medications  (Not in a hospital admission)    Vitals   Vitals:   09/13/22 1323 09/13/22 1330  BP: 130/79   Pulse: 91   Resp: 17   Temp: 98.4 F (36.9 C)   TempSrc: Oral   SpO2: 100%   Weight:  68 kg  Height:  5\' 1"  (1.549 m)     Body mass index is 28.34 kg/m.  Physical Exam   General: sitting up in triage chair; in no acute distress.  HENT: Normal oropharynx  and mucosa. Normal external appearance of ears and nose.  Neck: Supple, no pain or tenderness  CV: NSR on monitor. No peripheral edema.  Pulmonary: Unlabored, room air.  Skin: No rash. Normal palpation of skin.   Musculoskeletal: Normal digits and nails by inspection.   Neurologic Examination  Mental status/Cognition: Alert, oriented to self, place, month and year, good attention.  Speech/language: Fluent, comprehension intact, object naming intact, repetition intact. Cranial nerves:   CN II Pupils equal and reactive to light, no VF deficits    CN III,IV,VI EOM intact, no gaze preference or deviation, no nystagmus    CN V Decreased sensation in V1, V2, and V3 segments on left    CN VII no asymmetry, no nasolabial fold flattening    CN VIII normal hearing to speech    CN IX & X normal palatal elevation, no uvular deviation    CN XI 5/5 head turn and 5/5 shoulder shrug bilaterally    CN XII midline tongue protrusion    Motor:  Muscle bulk normal, tone normal, no pronator drift, no tremor  No focal weakness seen.  5/5 throughout all extremities.   Sensation: Decreased to left arm   Coordination/Complex Motor:  - Finger to Nose intact - Rapid alternating movement intact - Gait: WNL. Endorsed increased dizziness and nausea with continued gait assessment.   Labs   CBC: No results for input(s): "WBC", "NEUTROABS", "HGB", "HCT", "MCV", "PLT" in the last 168 hours.  Basic Metabolic Panel:  Lab Results  Component Value Date   NA 135 04/14/2020   K 3.9 04/14/2020   CO2 25 04/14/2020   GLUCOSE 104 (H) 04/14/2020   BUN 11 04/14/2020   CREATININE 0.65 04/14/2020   CALCIUM 9.3 04/14/2020   GFRNONAA >60 04/14/2020   GFRAA 133 03/04/2019   Lipid Panel:  Lab Results  Component Value Date   LDLCALC 89 03/04/2019   HgbA1c: No results found for: "HGBA1C" Urine Drug Screen:     Component Value Date/Time   LABOPIA NONE DETECTED 08/24/2017 1535   COCAINSCRNUR NONE DETECTED  08/24/2017 1535   LABBENZ NONE  DETECTED 08/24/2017 1535   AMPHETMU NONE DETECTED 08/24/2017 1535   THCU NONE DETECTED 08/24/2017 1535   LABBARB NONE DETECTED 08/24/2017 1535    Alcohol Level     Component Value Date/Time   ETH <10 08/24/2017 1746    CT Head without contrast(Personally reviewed): CTH was negative for a large hypodensity concerning for a large territory infarct or hyperdensity concerning for an ICH  MRI Brain w and wo (Personally reviewed): pending   Impression   Haylin Godar is a 32 y.o. female with PMH significant for asthma, anxiety/depression who presents to ED c/o dizziness and left facial numbness since 10 this morning. Her neurologic examination is notable for dizziness and left sided numbness with NIH 1.   Recommendations  - MRI Brain with and without C ______________________________________________________________________   Thank you for the opportunity to take part in the care of this patient. If you have any further questions, please contact the neurology consultation attending.   Pt seen by Neuro NP/APP and later by MD. Note/plan to be edited by MD as needed.    Lynnae January, DNP, AGACNP-BC [facility] Please use AMION for contact information & EPIC for messaging.  NEUROHOSPITALIST ADDENDUM Performed a face to face diagnostic evaluation.   I have reviewed the contents of history and physical exam as documented by PA/ARNP/Resident and agree with above documentation.  I have discussed and formulated the above plan as documented. Edits to the note have been made as needed.  Impression/Key exam findings/Plan: left sided numbness with unsteady/dizziness without any objective deficit on exam. No headaches. Will get MRI Brain w/o contrast. Not offered tnkase given too mild to treat and overall low suspicion for stroke. Not offered thrombectomy due to too mild to treat and presentation not concerning for an LVO.  Will get an MRI w/wo C as  MS too on the differential with her bring a yound female and if negative, no further workup and have her follow up outpatient. Would not expect facial numbness to come from spinal cord.  Erick Blinks, MD [facility] [phone]   If 7pm to 7am, please call on call as listed on AMION.

## 2022-09-13 NOTE — ED Notes (Signed)
Code Stroke activated per verbal order from RN Sarah/Dr. Rhae Hammock.

## 2022-09-13 NOTE — ED Provider Notes (Signed)
Casey Dennis EMERGENCY DEPARTMENT AT [facility] Provider Note   CSN: [id number] Arrival date & time: 09/13/22  1315     History  Chief Complaint  Patient presents with   Dizziness   Numbness    Casey Dennis is a 32 y.o. female with a past medical history significant for bipolar disorder who presents to the ED due to sudden onset of dizziness that started around 10 AM this morning.  She then developed left hand numbness/tingling and numbness/tingling to her chin.  Denies speech and visual changes.  No unilateral weakness.  No previous CVA.  Denies headache.  No history of migraines.  Patient describes dizziness as feeling off balance.  She was in her normal state of health prior to symptom onset.  Code stroke activated in triage.  History obtained from patient and past medical records. No interpreter used during encounter.       Home Medications Prior to Admission medications   Not on File      Allergies    Patient has no known allergies.    Review of Systems   Review of Systems  Eyes:  Negative for visual disturbance.  Neurological:  Positive for dizziness and numbness. Negative for facial asymmetry and headaches.    Physical Exam Updated Vital Signs BP 115/80   Pulse 81   Temp 98.4 F (36.9 C) (Oral)   Resp (!) 21   Ht 5\' 1"  (1.549 m)   Wt 68 kg   LMP 09/04/2022 (Approximate)   SpO2 100%   BMI 28.34 kg/m  Physical Exam Vitals and nursing note reviewed.  Constitutional:      General: She is not in acute distress.    Appearance: She is not ill-appearing.  HENT:     Head: Normocephalic.  Eyes:     Pupils: Pupils are equal, round, and reactive to light.  Cardiovascular:     Rate and Rhythm: Normal rate and regular rhythm.     Pulses: Normal pulses.     Heart sounds: Normal heart sounds. No murmur heard.    No friction rub. No gallop.  Pulmonary:     Effort: Pulmonary effort is normal.     Breath sounds: Normal breath sounds.  Abdominal:      General: Abdomen is flat. There is no distension.     Palpations: Abdomen is soft.     Tenderness: There is no abdominal tenderness. There is no guarding or rebound.  Musculoskeletal:        General: Normal range of motion.     Cervical back: Neck supple.  Skin:    General: Skin is warm and dry.  Neurological:     General: No focal deficit present.     Mental Status: She is alert.     Comments: Normal speech No facial droop Equal grip strength Subjective decreased sensation to lower face and left hand No pronator drift  Psychiatric:        Mood and Affect: Mood normal.        Behavior: Behavior normal.     ED Results / Procedures / Treatments   Labs (all labs ordered are listed, but only abnormal results are displayed) Labs Reviewed  COMPREHENSIVE METABOLIC PANEL - Abnormal; Notable for the following components:      Result Value   Glucose, Bld 113 (*)    Anion gap 16 (*)    All other components within normal limits  CBG MONITORING, ED - Abnormal; Notable for the following components:  Glucose-Capillary 114 (*)    All other components within normal limits  PROTIME-INR  APTT  CBC  DIFFERENTIAL  ETHANOL  HCG, SERUM, QUALITATIVE  I-STAT CHEM 8, ED    EKG None  Radiology CT HEAD CODE STROKE WO CONTRAST  Result Date: 09/13/2022 CLINICAL DATA:  Code stroke.  Dizziness EXAM: CT HEAD WITHOUT CONTRAST TECHNIQUE: Contiguous axial images were obtained from the base of the skull through the vertex without intravenous contrast. RADIATION DOSE REDUCTION: This exam was performed according to the departmental dose-optimization program which includes automated exposure control, adjustment of the mA and/or kV according to patient size and/or use of iterative reconstruction technique. COMPARISON:  None Available. FINDINGS: Brain: No evidence of hemorrhage, hydrocephalus, extra-axial collection or mass lesion/mass effect. Asymmetric hypodensity in the left cerebellar hemisphere (series  5, image 19). It is unclear if this is artifactual or secondary to an acute infarct. Vascular: No hyperdense vessel or unexpected calcification. Skull: Normal. Negative for fracture or focal lesion. Sinuses/Orbits: No acute finding. Other: None. ASPECTS [facility] Stroke Program Early CT Score): 10 IMPRESSION: Asymmetric left cerebellar hypodensity may be artifactual, but an infarct is not excluded. No hemorrhage. Findings were paged to Dr. Derry Lory on 09/13/22 at 2:09 PM via [facility] paging system. Electronically Signed   By: Lorenza Cambridge M.D.   On: 09/13/2022 14:11    Procedures .Critical Care  Performed by: Mannie Stabile, PA-C Authorized by: Mannie Stabile, PA-C   Critical care provider statement:    Critical care time (minutes):  31   Critical care was necessary to treat or prevent imminent or life-threatening deterioration of the following conditions:  CNS failure or compromise   Critical care was time spent personally by me on the following activities:  Development of treatment plan with patient or surrogate, discussions with consultants, evaluation of patient's response to treatment, examination of patient, ordering and review of laboratory studies, ordering and review of radiographic studies, ordering and performing treatments and interventions, pulse oximetry, re-evaluation of patient's condition and review of old charts   I assumed direction of critical care for this patient from another provider in my specialty: no     Care discussed with: admitting provider       Medications Ordered in ED Medications  gadobutrol (GADAVIST) 1 MMOL/ML injection 7 mL (7 mLs Intravenous Contrast Given 09/13/22 1519)    ED Course/ Medical Decision Making/ A&P                                 Medical Decision Making Amount and/or Complexity of Data Reviewed Labs: ordered. Decision-making details documented in ED Course. Radiology: ordered and independent interpretation performed.  Decision-making details documented in ED Course. ECG/medicine tests: ordered and independent interpretation performed. Decision-making details documented in ED Course.  Risk Prescription drug management.   This patient presents to the ED for concern of numbness/tingling dizziness, this involves an extensive number of treatment options, and is a complaint that carries with it a high risk of complications and morbidity.  The differential diagnosis includes CVA, TIA, metabolic derangement, MS, etc  32 year old female presents to the ED due to sudden onset of dizziness that started around 10 AM this morning associated with facial numbness/tingling and left arm numbness/tingling.  No headache.  Denies visual and speech changes.  No weakness.  Patient activated as a code stroke in triage.  Patient as she was being wheeled to CT scan.  Airway cleared.  No neurological deficits except possible decreased sensation to left hand and lower face.  CT head negative.  Stroke labs ordered.  MRI with and without contrast per neurology.  Unremarkable.  No leukocytosis.  Normal hemoglobin.  CMP reassuring.  Normal renal function.  Negative pregnancy test.  Ethanol within normal limits.  Patient handed off to Lenna Sciara, PA-C at shift change pending MRI and neurology recommendations.   Lives at home Has PCP Hx bipolar disorder       Final Clinical Impression(s) / ED Diagnoses Final diagnoses:  Dizziness  Paresthesias    Rx / DC Orders ED Discharge Orders     None         Jesusita Oka 09/13/22 1532    Gloris Manchester, MD 09/13/22 1712

## 2022-09-27 ENCOUNTER — Telehealth: Payer: Self-pay

## 2022-09-27 NOTE — Telephone Encounter (Signed)
Transition Care Management Unsuccessful Follow-up Telephone Call  Date of discharge and from where:  09/13/2022 [facility]  Attempts:  1st Attempt  Reason for unsuccessful TCM follow-up call:  No answer/busy  Deshon Koslowski [facility]  [facility], [facility] Resource Care Guide Direct Dial: [phone]  Website: [email or web address]

## 2022-09-28 ENCOUNTER — Telehealth: Payer: Self-pay

## 2022-09-28 NOTE — Telephone Encounter (Signed)
Transition Care Management Unsuccessful Follow-up Telephone Call  Date of discharge and from where:  09/13/2022 [facility]  Attempts:  2nd Attempt  Reason for unsuccessful TCM follow-up call:  Left voice message  Zaxton Angerer [facility]  [facility], [facility] Resource Care Guide Direct Dial: [phone]  Website: [email or web address]

## 2022-10-23 DIAGNOSIS — R051 Acute cough: Secondary | ICD-10-CM | POA: Diagnosis not present

## 2022-10-23 DIAGNOSIS — R070 Pain in throat: Secondary | ICD-10-CM | POA: Diagnosis not present

## 2022-10-23 DIAGNOSIS — R6883 Chills (without fever): Secondary | ICD-10-CM | POA: Diagnosis not present

## 2022-10-23 DIAGNOSIS — J029 Acute pharyngitis, unspecified: Secondary | ICD-10-CM | POA: Diagnosis not present

## 2023-10-12 IMAGING — US US MFM OB LIMITED
1 series · 14 of 28 positions shown · non-contrast
Comparison: none

[Series 1: us mfm ob limited · 14 of 40 slices shown]
[im 2/40]
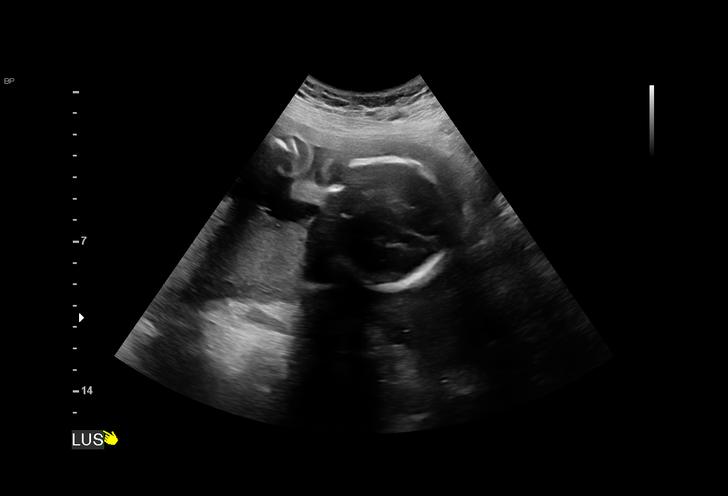
[im 5/40]
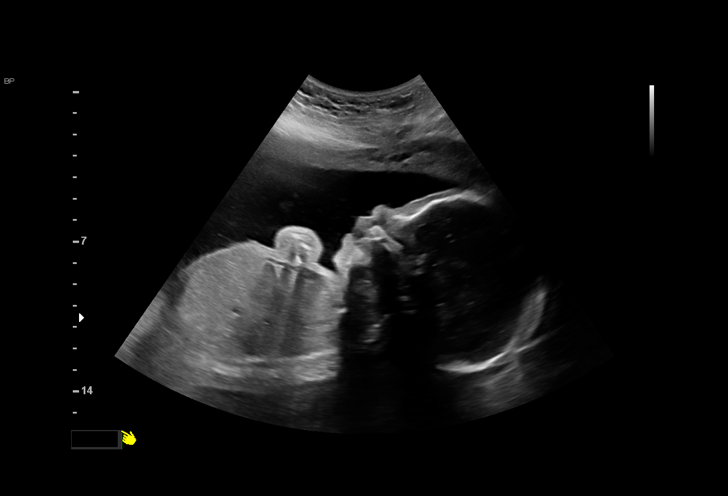
[im 8/40]
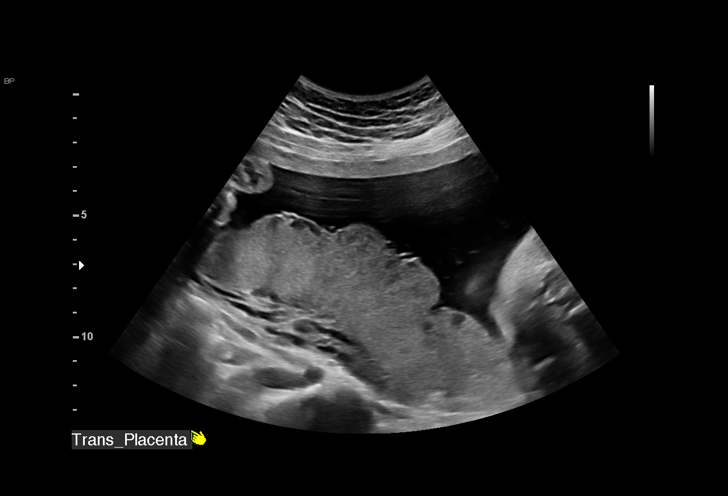
[im 11/40]
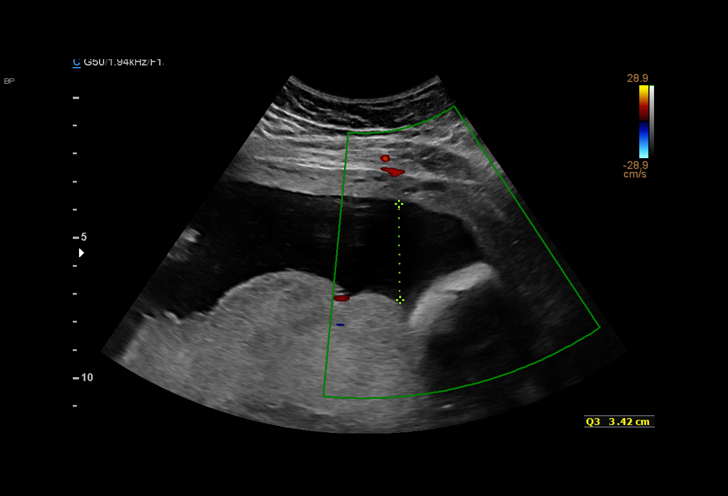
[im 14/40]
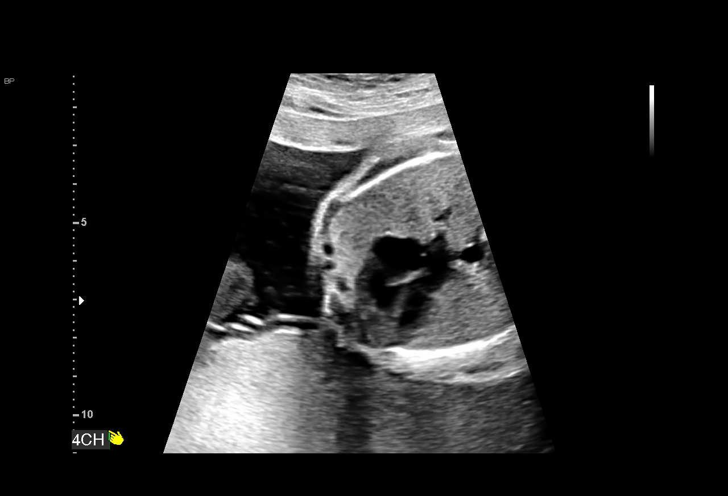
[im 16/40]
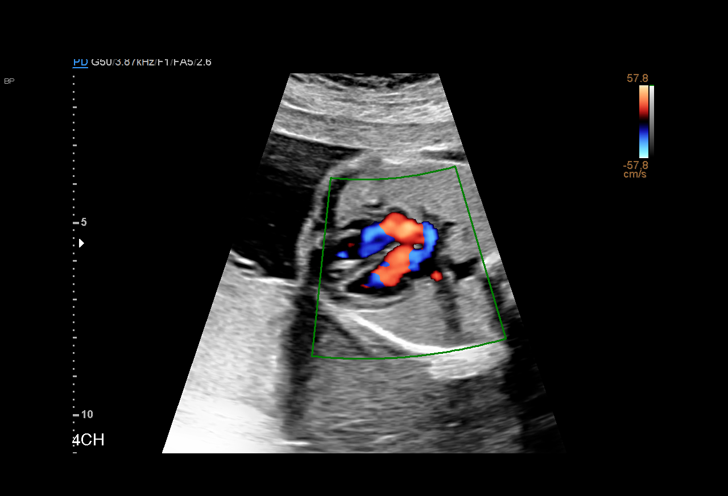
[im 19/40]
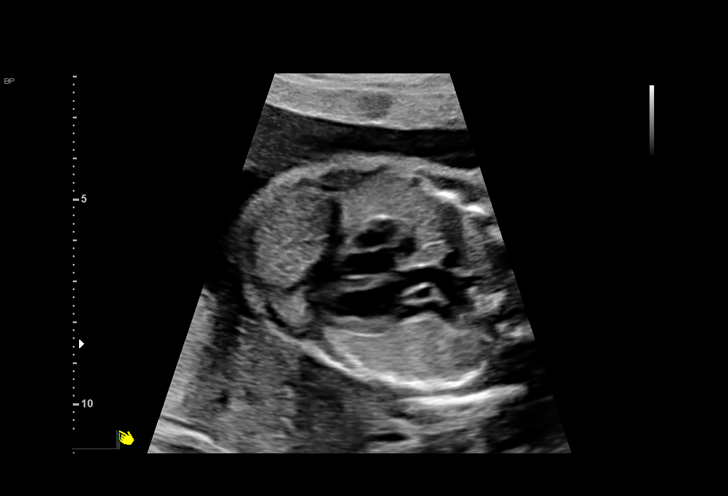
[im 22/40]
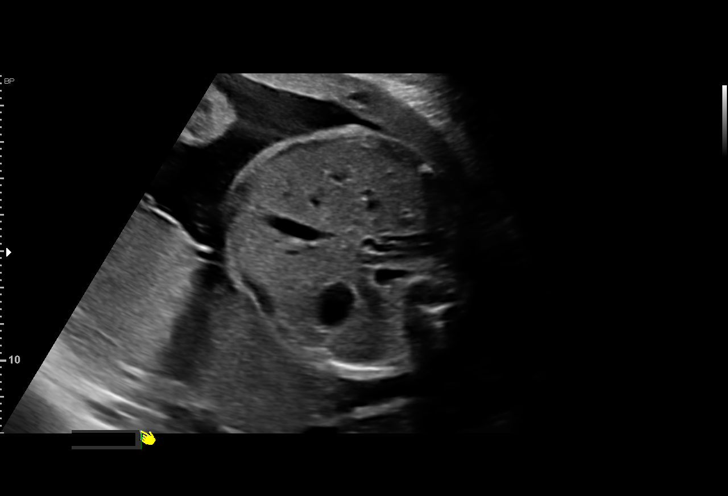
[im 25/40]
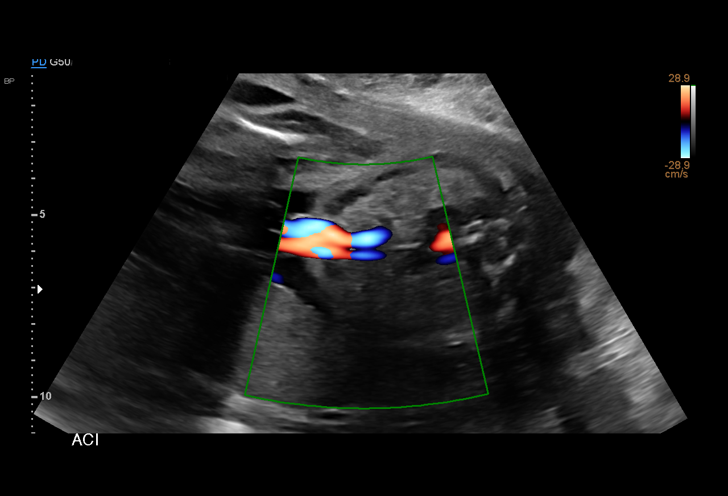
[im 28/40]
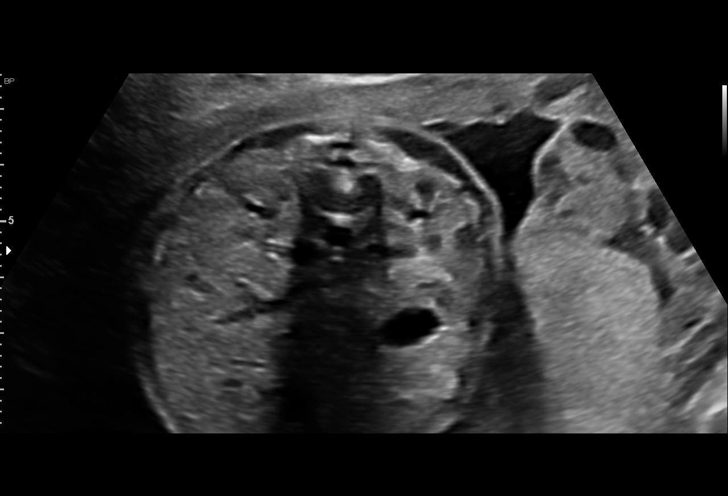
[im 31/40]
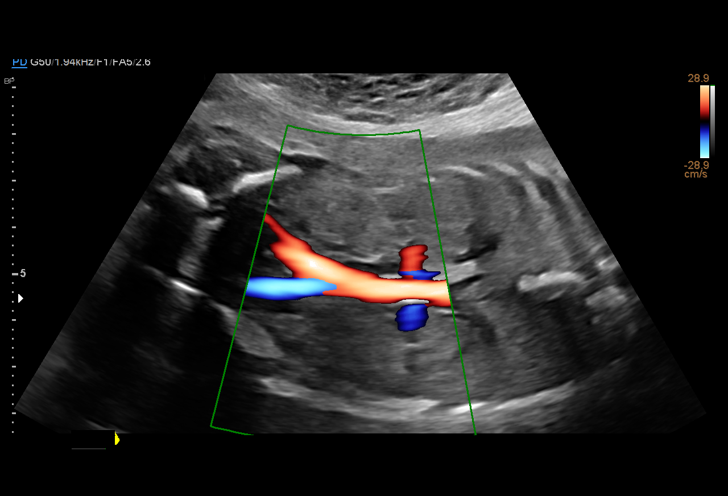
[im 34/40]
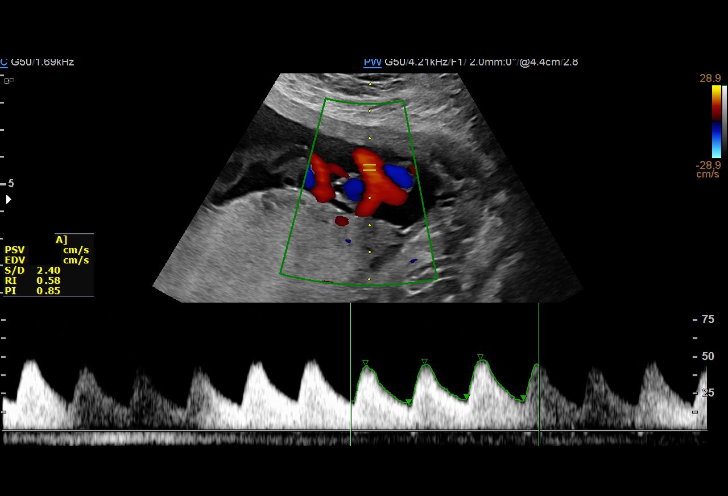
[im 37/40]
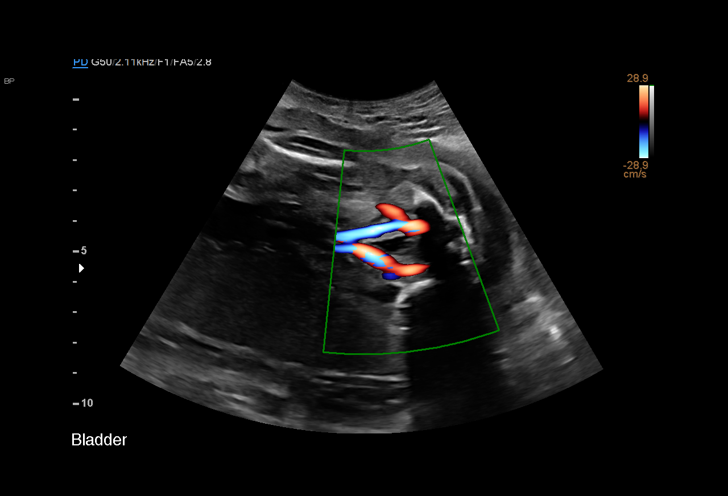
[im 40/40]
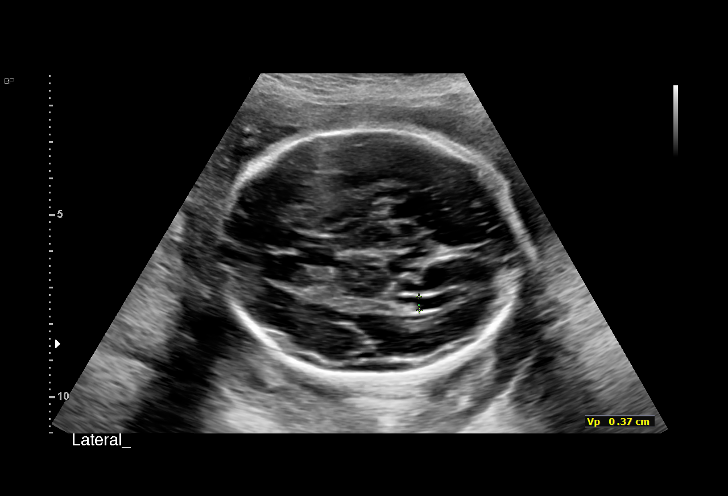

[14 of 28 positions shown; findings below may reference images not displayed]

OBGYN
                                                            [REDACTED]
                   [facility]

Indications

 Maternal care for known or suspected poor
 fetal growth, third trimester, not applicable or
 unspecified IUGR
 28 weeks gestation of pregnancy
 LR NIPS Male/ Neg AFP
Fetal Evaluation

 Num Of Fetuses:         1
 Fetal Heart Rate(bpm):  158
 Cardiac Activity:       Observed
 Presentation:           Cephalic
 Placenta:               Posterior
 P. Cord Insertion:      Previously Visualized

 Amniotic Fluid
 AFI FV:      Within normal limits

 AFI Sum(cm)     %Tile       Largest Pocket(cm)
 12              28

 RUQ(cm)       RLQ(cm)       LUQ(cm)        LLQ(cm)

Biometry

 LV:        3.7  mm
OB History

 Gravidity:    4         Term:   1
 TOP:          1       Ectopic:  1        Living: 1
Gestational Age

 LMP:           28w 0d        Date:  11/16/20                 EDD:   08/23/21
 Best:          28w 0d     Det. By:  LMP  (11/16/20)          EDD:   08/23/21
Anatomy

 Cranium:               Appears normal         Diaphragm:              Appears normal
 Cavum:                 Appears normal         Stomach:                Appears normal, left
                                                                       sided
 Ventricles:            Appears normal         Abdominal Wall:         Appears nml (cord
                                                                       insert, abd wall)
 Heart:                 Appears normal         Cord Vessels:           Appears normal (3
                        (4CH, axis, and                                vessel cord)
                        situs)
 RVOT:                  Appears normal         Kidneys:                Appear normal
 LVOT:                  Appears normal         Bladder:                Appears normal
 Aortic Arch:           Appears normal

 Other:  Technically difficult due to fetal position.
Doppler - Fetal Vessels

 Umbilical Artery
  S/D     %tile      RI    %tile      PI    %tile            ADFV    RDFV
  2.68       30    0.63       35    0.95       39               No      No

Cervix Uterus Adnexa

 Cervix
 Not visualized (advanced GA >93wks)

 Uterus
 Normal shape and size.

 Right Ovary
 Within normal limits.

 Left Ovary
 Within normal limits.
Impression
 Fetal growth restriction.  On ultrasound performed 2 weeks
 ago, the estimated fetal weight was at the 7th percentile.
 Patient does not have gestational diabetes.  Blood pressure
 today at her office is 112/51 mmHg.

 Amniotic fluid is normal and good fetal activity seen.
 Umbilical artery Doppler showed normal forward diastolic flow.
 I reassured the patient of the findings.
Recommendations

 -Fetal growth assessment and UA Doppler study next week
                [place], Maksim

## 2023-11-10 IMAGING — US US MFM FETAL BPP W/ NON-STRESS
1 series · 13 of 28 positions shown · non-contrast
Comparison: none

[Series 1: us mfm fetal bpp w/ non-stress · 29 acquisitions, 13 frames shown]
[im 2/29]
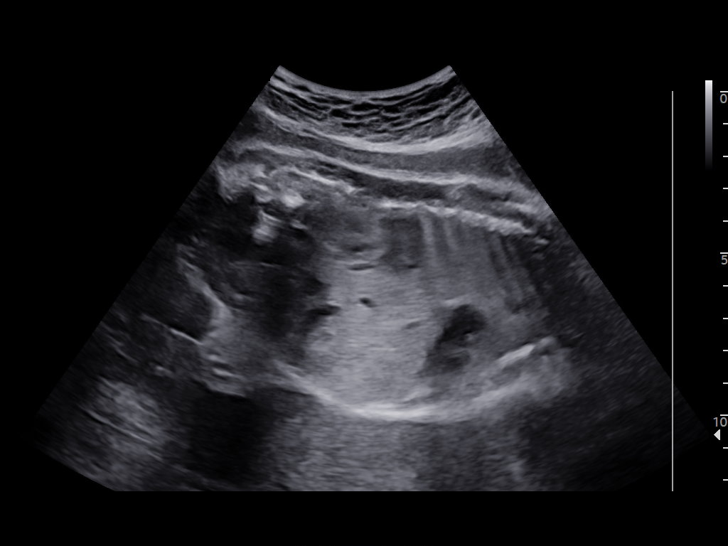
[im 4/29]
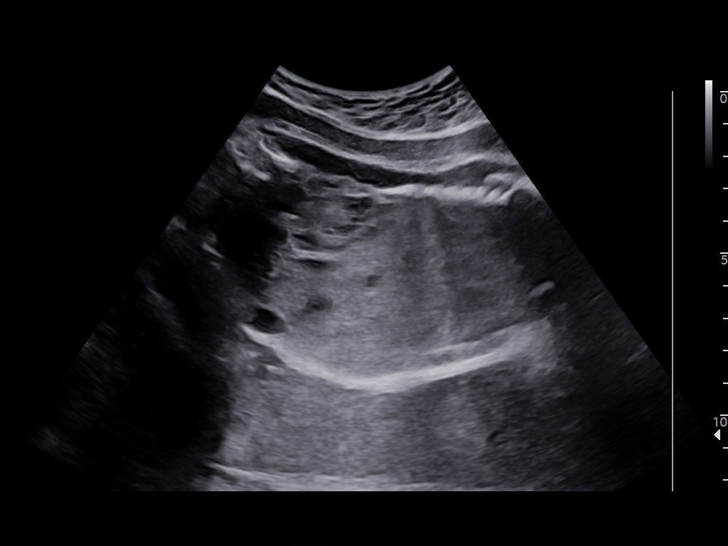
[im 6/29]
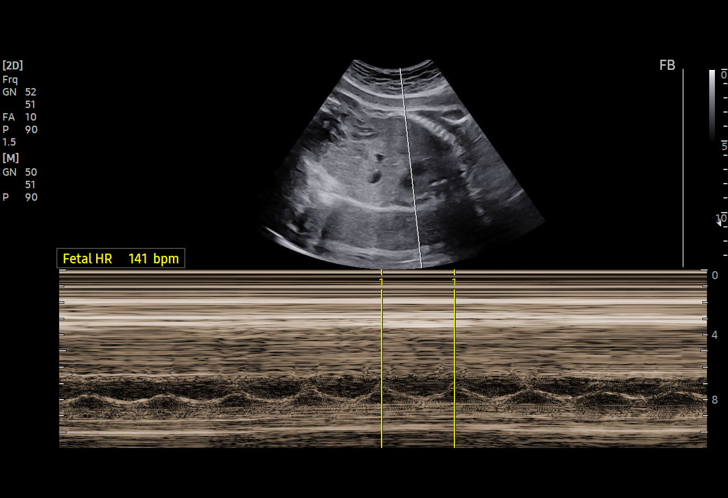
[im 8/29]
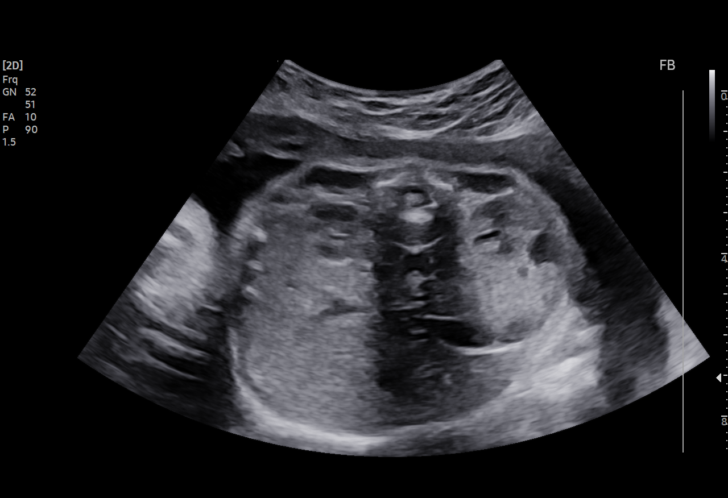
[im 10/29]
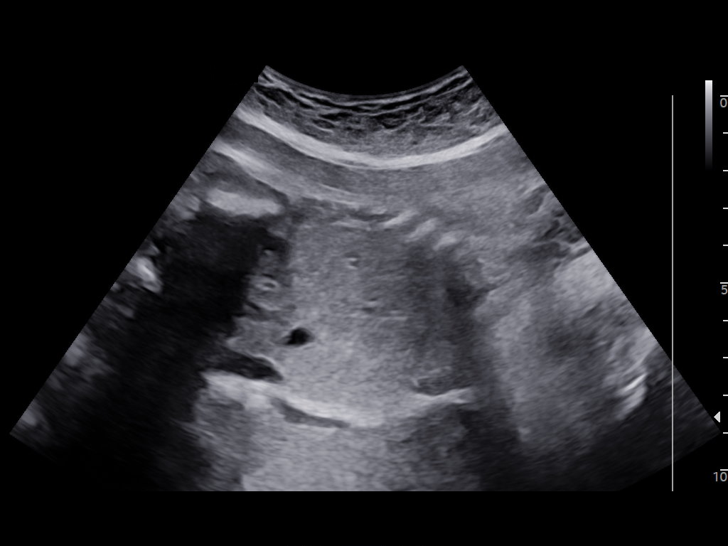
[im 12/29]
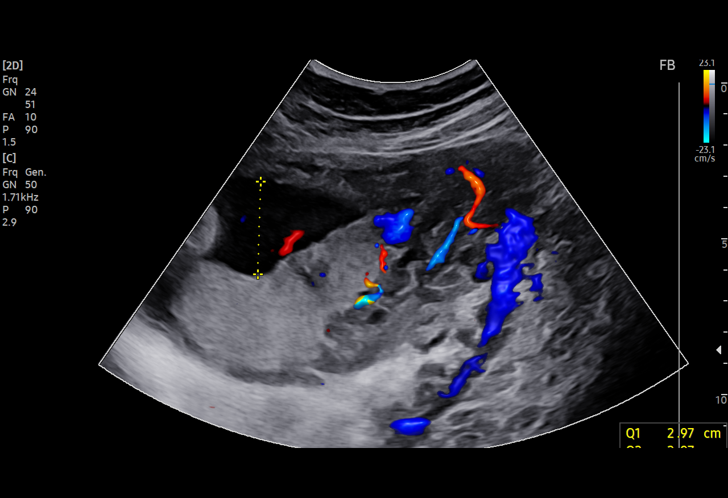
[im 15/29]
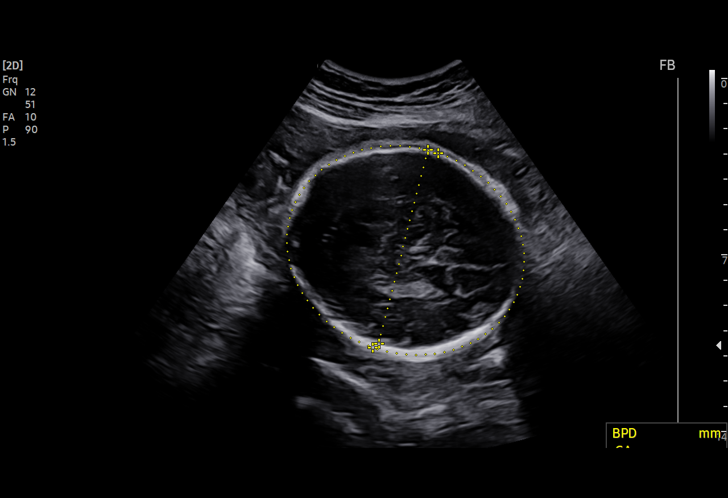
[im 17/29]
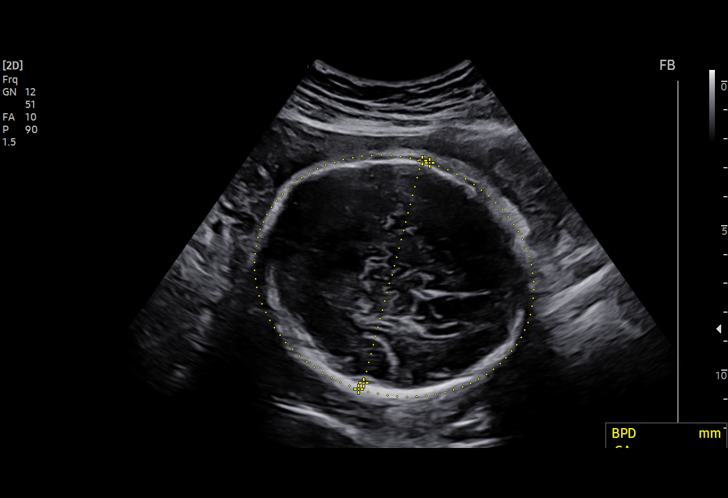
[im 19/29]
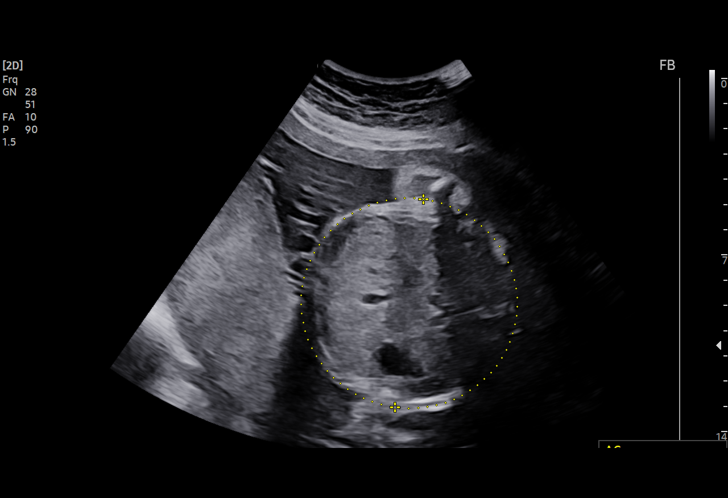
[im 21/29]
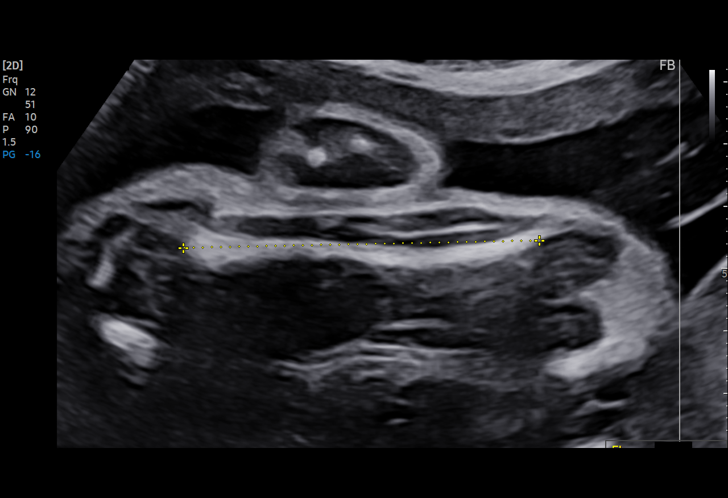
[im 23/29]
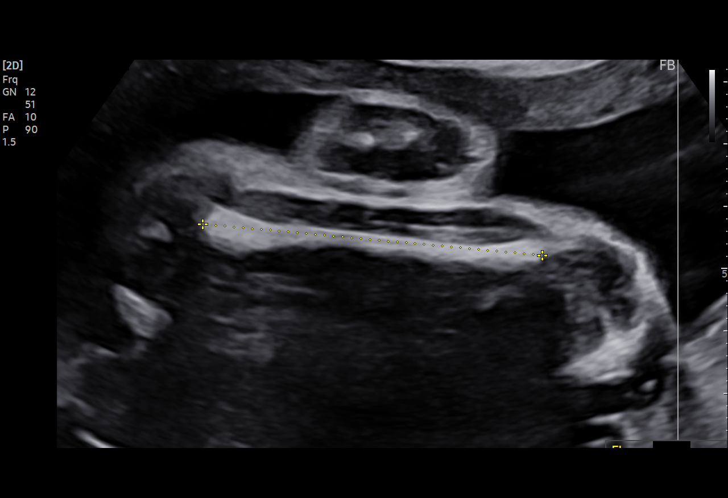
[im 25/29]
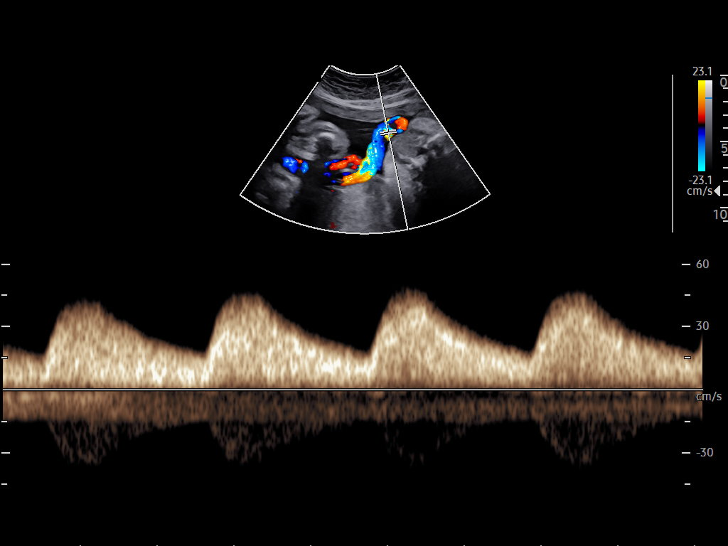
[im 27/29]
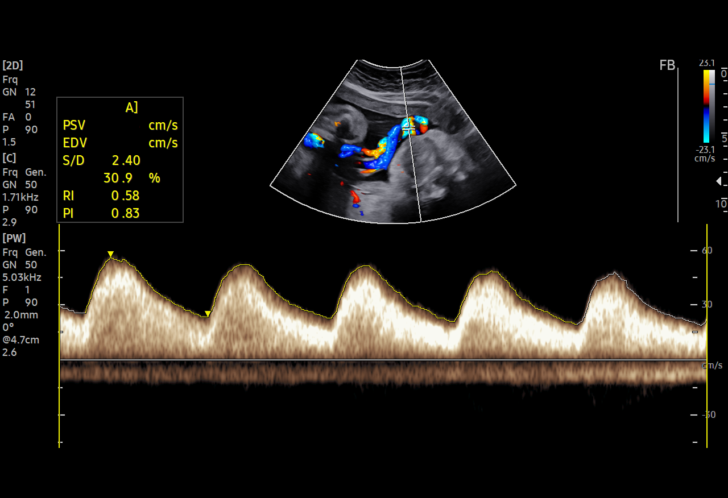

[13 of 28 positions shown; findings below may reference images not displayed]

OBGYN
                                                            [REDACTED]
                   [facility]

    KWAI YIN/NONSTRESS

Indications

 Maternal care for known or suspected poor
 fetal growth, third trimester, not applicable or
 unspecified IUGR
 LR NIPS Male/ Neg AFP
 32 weeks gestation of pregnancy
Fetal Evaluation

 Num Of Fetuses:         1
 Fetal Heart Rate(bpm):  141
 Cardiac Activity:       Observed
 Presentation:           Cephalic
 Placenta:               Posterior
 P. Cord Insertion:      Previously Visualized

 Amniotic Fluid
 AFI FV:      Within normal limits

 AFI Sum(cm)     %Tile       Largest Pocket(cm)
 12.19           33

 RUQ(cm)       RLQ(cm)       LUQ(cm)        LLQ(cm)

Biophysical Evaluation

 Amniotic F.V:   Pocket => 2 cm             F. Tone:        Observed
 F. Movement:    Observed                   N.S.T:          Reactive
 F. Breathing:   Observed                   Score:          [DATE]
Biometry

 BPD:     79.53  mm     G. Age:  31w 6d         34  %    CI:        79.86   %    70 - 86
                                                         FL/HC:      19.7   %    19.1 -
 HC:    281.21   mm     G. Age:  30w 6d        1.9  %    HC/AC:      1.05        0.96 -
 AC:    267.57   mm     G. Age:  30w 6d         15  %    FL/BPD:     69.8   %    71 - 87
 FL:      55.51  mm     G. Age:  29w 2d        < 1  %    FL/AC:      20.7   %    20 - 24

 Est. FW:    7401  gm      3 lb 7 oz      5  %
OB History

 Gravidity:    4         Term:   1
 TOP:          1       Ectopic:  1        Living: 1
Gestational Age

 LMP:           32w 1d        Date:  11/16/20                 EDD:   08/23/21
 U/S Today:     30w 5d                                        EDD:   09/02/21
 Best:          32w 1d     Det. By:  LMP  (11/16/20)          EDD:   08/23/21
Anatomy

 Cranium:               Appears normal         LVOT:                   Previously seen
 Cavum:                 Appears normal         Aortic Arch:            Previously seen
 Ventricles:            Previously seen        Ductal Arch:            Not well visualized
 Choroid Plexus:        Previously seen        Diaphragm:              Appears normal
 Cerebellum:            Previously seen        Stomach:                Appears normal, left
                                                                       sided
 Posterior Fossa:       Previously seen        Abdomen:                Appears normal
 Nuchal Fold:           Not applicable (>20    Abdominal Wall:         Previously seen
                        wks GA)
 Face:                  Orbits and profile     Cord Vessels:           Previously seen
                        previously seen
 Lips:                  Previously seen        Kidneys:                Appear normal
 Palate:                Not well visualized    Bladder:                Appears normal
 Thoracic:              Appears normal         Spine:                  Previously seen
 Heart:                 Previously seen, EIF   Upper Extremities:      Previously seen
 RVOT:                  Previously seen        Lower Extremities:      Previously seen

 Other:  Fetus appears to be a male. Nasal bone prev visualized. Lenses prev
         visualized. Heels/feet and open hands/5th digits prev visualized. VC,
         3VV and 3VTV prev visualized.
Doppler - Fetal Vessels

 Umbilical Artery
  S/D     %tile      RI    %tile      PI    %tile     PSV    ADFV    RDFV
                                                    (cm/s)
  2.41       33    0.59       41    0.[REDACTED]      No      No
Cervix Uterus Adnexa

 Adnexa
 No abnormality visualized.
Comments

 This patient was seen for IUGR. She denies any problems
 since her last exam and reports feeling vigorous fetal
 movements throughout the day.
 On today's exam, the EFW measures at the 5th percentile for
 her gestational age.  The fetus has grown over 1 pound over
 the past 3 weeks.  There was normal amniotic fluid noted.
 A biophysical profile performed today due to fetal growth
 restriction was [DATE] with a reactive NST.
 Doppler studies of the umbilical arteries showed a normal
 S/D ratio of 2.41.  There were no signs of absent or reversed
 end-diastolic flow.
 Due to fetal growth restriction, we will continue to follow her
 with weekly fetal testing and umbilical artery Doppler studies.
 She will return for another BPP and umbilical artery Doppler
 study in 1 week.
